# Patient Record
Sex: Female | Born: 1965
Health system: Southern US, Community
[De-identification: ages and names within clinical notes are randomized; demographics above are authoritative.]

## PROBLEM LIST (undated history)

## (undated) DIAGNOSIS — F419 Anxiety disorder, unspecified: Secondary | ICD-10-CM

## (undated) DIAGNOSIS — F329 Major depressive disorder, single episode, unspecified: Secondary | ICD-10-CM

## (undated) DIAGNOSIS — M1990 Unspecified osteoarthritis, unspecified site: Secondary | ICD-10-CM

## (undated) DIAGNOSIS — K649 Unspecified hemorrhoids: Secondary | ICD-10-CM

## (undated) DIAGNOSIS — E785 Hyperlipidemia, unspecified: Secondary | ICD-10-CM

## (undated) DIAGNOSIS — Z803 Family history of malignant neoplasm of breast: Secondary | ICD-10-CM

## (undated) DIAGNOSIS — C801 Malignant (primary) neoplasm, unspecified: Secondary | ICD-10-CM

## (undated) DIAGNOSIS — I10 Essential (primary) hypertension: Secondary | ICD-10-CM

## (undated) DIAGNOSIS — F32A Depression, unspecified: Secondary | ICD-10-CM

## (undated) HISTORY — DX: Unspecified hemorrhoids: K64.9

## (undated) HISTORY — DX: Anxiety disorder, unspecified: F41.9

## (undated) HISTORY — DX: Essential (primary) hypertension: I10

## (undated) HISTORY — DX: Depression, unspecified: F32.A

## (undated) HISTORY — DX: Malignant (primary) neoplasm, unspecified: C80.1

## (undated) HISTORY — DX: Hyperlipidemia, unspecified: E78.5

## (undated) HISTORY — PX: COLONOSCOPY: SHX174

## (undated) HISTORY — DX: Unspecified osteoarthritis, unspecified site: M19.90

## (undated) HISTORY — DX: Major depressive disorder, single episode, unspecified: F32.9

## (undated) HISTORY — PX: PARTIAL HYSTERECTOMY: SHX80

## (undated) HISTORY — PX: MYOMECTOMY: SHX85

## (undated) HISTORY — DX: Family history of malignant neoplasm of breast: Z80.3

---

## 2010-04-24 ENCOUNTER — Ambulatory Visit: Payer: Self-pay | Admitting: Genetic Counselor

## 2010-07-19 ENCOUNTER — Encounter: Payer: BC Managed Care – PPO | Admitting: Genetic Counselor

## 2010-12-24 ENCOUNTER — Ambulatory Visit: Payer: BC Managed Care – PPO | Attending: Specialist | Admitting: Physical Therapy

## 2010-12-24 DIAGNOSIS — R5381 Other malaise: Secondary | ICD-10-CM | POA: Insufficient documentation

## 2010-12-24 DIAGNOSIS — M2560 Stiffness of unspecified joint, not elsewhere classified: Secondary | ICD-10-CM | POA: Insufficient documentation

## 2010-12-24 DIAGNOSIS — M6281 Muscle weakness (generalized): Secondary | ICD-10-CM | POA: Insufficient documentation

## 2010-12-24 DIAGNOSIS — IMO0001 Reserved for inherently not codable concepts without codable children: Secondary | ICD-10-CM | POA: Insufficient documentation

## 2010-12-24 DIAGNOSIS — M2550 Pain in unspecified joint: Secondary | ICD-10-CM | POA: Insufficient documentation

## 2010-12-25 ENCOUNTER — Ambulatory Visit: Payer: BC Managed Care – PPO

## 2010-12-31 ENCOUNTER — Ambulatory Visit: Payer: BC Managed Care – PPO | Attending: Specialist | Admitting: Physical Therapy

## 2010-12-31 DIAGNOSIS — M2560 Stiffness of unspecified joint, not elsewhere classified: Secondary | ICD-10-CM | POA: Insufficient documentation

## 2010-12-31 DIAGNOSIS — R5381 Other malaise: Secondary | ICD-10-CM | POA: Insufficient documentation

## 2010-12-31 DIAGNOSIS — M6281 Muscle weakness (generalized): Secondary | ICD-10-CM | POA: Insufficient documentation

## 2010-12-31 DIAGNOSIS — M2550 Pain in unspecified joint: Secondary | ICD-10-CM | POA: Insufficient documentation

## 2010-12-31 DIAGNOSIS — IMO0001 Reserved for inherently not codable concepts without codable children: Secondary | ICD-10-CM | POA: Insufficient documentation

## 2011-01-02 ENCOUNTER — Ambulatory Visit: Payer: BC Managed Care – PPO | Admitting: Physical Therapy

## 2011-01-03 ENCOUNTER — Encounter: Payer: BC Managed Care – PPO | Admitting: Physical Therapy

## 2011-01-06 ENCOUNTER — Ambulatory Visit: Payer: BC Managed Care – PPO | Admitting: Physical Therapy

## 2011-01-07 ENCOUNTER — Encounter: Payer: BC Managed Care – PPO | Admitting: Physical Therapy

## 2011-01-09 ENCOUNTER — Ambulatory Visit: Payer: BC Managed Care – PPO | Admitting: Physical Therapy

## 2011-01-10 ENCOUNTER — Encounter: Payer: BC Managed Care – PPO | Admitting: Physical Therapy

## 2011-01-14 ENCOUNTER — Ambulatory Visit: Payer: BC Managed Care – PPO | Admitting: Physical Therapy

## 2011-01-16 ENCOUNTER — Ambulatory Visit: Payer: BC Managed Care – PPO | Admitting: Physical Therapy

## 2011-01-21 ENCOUNTER — Encounter: Payer: BC Managed Care – PPO | Admitting: Physical Therapy

## 2011-01-23 ENCOUNTER — Ambulatory Visit: Payer: BC Managed Care – PPO | Admitting: Physical Therapy

## 2011-07-09 ENCOUNTER — Emergency Department (HOSPITAL_COMMUNITY)
Admission: EM | Admit: 2011-07-09 | Discharge: 2011-07-10 | Disposition: A | Discharge status: Home / Self Care | Payer: BC Managed Care – PPO | Attending: Emergency Medicine | Admitting: Emergency Medicine

## 2011-07-09 ENCOUNTER — Encounter (HOSPITAL_COMMUNITY): Payer: Self-pay | Admitting: *Deleted

## 2011-07-09 ENCOUNTER — Emergency Department (HOSPITAL_COMMUNITY): Payer: BC Managed Care – PPO

## 2011-07-09 DIAGNOSIS — IMO0002 Reserved for concepts with insufficient information to code with codable children: Secondary | ICD-10-CM | POA: Insufficient documentation

## 2011-07-09 DIAGNOSIS — X500XXA Overexertion from strenuous movement or load, initial encounter: Secondary | ICD-10-CM | POA: Insufficient documentation

## 2011-07-09 DIAGNOSIS — M25569 Pain in unspecified knee: Secondary | ICD-10-CM | POA: Insufficient documentation

## 2011-07-09 DIAGNOSIS — S8390XA Sprain of unspecified site of unspecified knee, initial encounter: Secondary | ICD-10-CM

## 2011-07-09 DIAGNOSIS — M25469 Effusion, unspecified knee: Secondary | ICD-10-CM

## 2011-07-09 MED ORDER — HYDROCODONE-ACETAMINOPHEN 5-325 MG PO TABS
1.0000 | ORAL_TABLET | Freq: Once | ORAL | Status: AC
  Administered 2011-07-09: 1 via ORAL
  Filled 2011-07-09: qty 1

## 2011-07-09 MED ORDER — OXYCODONE-ACETAMINOPHEN 5-325 MG PO TABS: 1.0000 | ORAL_TABLET | Freq: Four times a day (QID) | ORAL | Status: AC | PRN

## 2011-07-09 NOTE — Discharge Instructions (Signed)
X-rays do not show any broken bones or dislocations. Continue to use rest, ice, compression and elevation to help reduce pain and swelling in her knee. Please followup with orthopedic specialist for continued evaluation and treatment.   Knee Effusion The medical term for having fluid in your knee is effusion. This is often due to an internal derangement of the knee. This means something is wrong inside the knee. Some of the causes of fluid in the knee may be torn cartilage, a torn ligament, or bleeding into the joint from an injury. Your knee is likely more difficult to bend and move. This is often because there is increased pain and pressure in the joint. The time it takes for recovery from a knee effusion depends on different factors, including:   Type of injury.   Your age.   Physical and medical conditions.   Rehabilitation Strategies.  How long you will be away from your normal activities will depend on what kind of knee problem you have and how much damage is present. Your knee has two types of cartilage. Articular cartilage covers the bone ends and lets your knee bend and move smoothly. Two menisci, thick pads of cartilage that form a rim inside the joint, help absorb shock and stabilize your knee. Ligaments bind the bones together and support your knee joint. Muscles move the joint, help support your knee, and take stress off the joint itself. CAUSES  Often an effusion in the knee is caused by an injury to one of the menisci. This is often a tear in the cartilage. Recovery after a meniscus injury depends on how much meniscus is damaged and whether you have damaged other knee tissue. Small tears may heal on their own with conservative treatment. Conservative means rest, limited weight bearing activity and muscle strengthening exercises. Your recovery may take up to 6 weeks.  TREATMENT  Larger tears may require surgery. Meniscus injuries may be treated during arthroscopy. Arthroscopy is a  procedure in which your surgeon uses a small telescope like instrument to look in your knee. Your caregiver can make a more accurate diagnosis (learning what is wrong) by performing an arthroscopic procedure. If your injury is on the inner margin of the meniscus, your surgeon may trim the meniscus back to a smooth rim. In other cases your surgeon will try to repair a damaged meniscus with stitches (sutures). This may make rehabilitation take longer, but may provide better long term result by helping your knee keep its shock absorption capabilities. Ligaments which are completely torn usually require surgery for repair. HOME CARE INSTRUCTIONS  Use crutches as instructed.   If a brace is applied, use as directed.   Once you are home, an ice pack applied to your swollen knee may help with discomfort and help decrease swelling.   Keep your knee raised (elevated) when you are not up and around or on crutches.   Only take over-the-counter or prescription medicines for pain, discomfort, or fever as directed by your caregiver.   Your caregivers will help with instructions for rehabilitation of your knee. This often includes strengthening exercises.   You may resume a normal diet and activities as directed.  SEEK MEDICAL CARE IF:   There is increased swelling in your knee.   You notice redness, swelling, or increasing pain in your knee.   An unexplained oral temperature above 102 F (38.9 C) develops.  SEEK IMMEDIATE MEDICAL CARE IF:   You develop a rash.   You have difficulty  breathing.   You have any allergic reactions from medications you may have been given.   There is severe pain with any motion of the knee.  MAKE SURE YOU:   Understand these instructions.   Will watch your condition.   Will get help right away if you are not doing well or get worse.  Document Released: 06/07/2003 Document Revised: 03/06/2011 Document Reviewed: 08/11/2007 [facility] Patient Information 2012  [place], [place].   Knee Sprain You have a knee sprain. Sprains are painful injuries to the joints. A sprain is a partial or complete tearing of ligaments. Ligaments are tough, fibrous tissues that hold bones together at the joints. A strain (sprain) has occurred when a ligament is stretched or damaged. This injury may take several weeks to heal. This is often the same length of time as a bone fracture (break in bone) takes to heal. Even though a fracture (bone break) may not have occurred, the recovery times may be similar. HOME CARE INSTRUCTIONS   Rest the injured area for as long as directed by your caregiver. Then slowly start using the joint as directed by your caregiver and as the pain allows. Use crutches as directed. If the knee was splinted or casted, continue use and care as directed. If an ace bandage has been applied today, it should be removed and reapplied every 3 to 4 hours. It should not be applied tightly, but firmly enough to keep swelling down. Watch toes and feet for swelling, bluish discoloration, coldness, numbness or excessive pain. If any of these symptoms occur, remove the ace bandage and reapply more loosely.If these symptoms persist, seek medical attention.   For the first 24 hours, lie down. Keep the injured extremity elevated on two pillows.   Apply ice to the injured area for 15 to 20 minutes every couple hours. Repeat this 3 to 4 times per day for the first 48 hours. Put the ice in a plastic bag and place a towel between the bag of ice and your skin.   Wear any splinting, casting, or elastic bandage applications as instructed.   Only take over-the-counter or prescription medicines for pain, discomfort, or fever as directed by your caregiver. Do not use aspirin immediately after the injury unless instructed by your caregiver. Aspirin can cause increased bleeding and bruising of the tissues.   If you were given crutches, continue to use them as instructed. Do not resume  weight bearing on the affected extremity until instructed.  Persistent pain and inability to use the injured area as directed for more than 2 to 3 days are warning signs. If this happens you should see a caregiver for a follow-up visit as soon as possible. Initially, a hairline fracture (this is the same as a broken bone) may not be evident on x-rays. Persistent pain and swelling indicate that further evaluation, non-weight bearing (use of crutches as instructed), and/or further x-rays are indicated. X-rays may sometimes not show a small fracture until a week or ten days later. Make a follow-up appointment with your own caregiver or one to whom we have referred you. A radiologist (specialist in reading x-rays) may re-read your X-rays. Make sure you know how you are to get your x-ray results. Do not assume everything is normal if you do not hear from [place]. SEEK MEDICAL CARE IF:   Bruising, swelling, or pain increases.   You have cold or numb toes   You have continuing difficulty or pain with walking.  SEEK IMMEDIATE MEDICAL  CARE IF:   Your toes are cold, numb or blue.   The pain is not responding to medications and continues to stay the same or get worse.  MAKE SURE YOU:   Understand these instructions.   Will watch your condition.   Will get help right away if you are not doing well or get worse.  Document Released: 03/17/2005 Document Revised: 03/06/2011 Document Reviewed: 03/01/2007 [facility] Patient Information 2012 [place], [place].

## 2011-07-09 NOTE — ED Provider Notes (Signed)
History     CSN: [id number]  Arrival date & time 07/09/11  2021   First MD Initiated Contact with Patient 07/09/11 2141      Chief Complaint  Patient presents with  . Knee Pain     HPI  History provided by the patient. Patient is a 46 year old female with no significant past medical history who presents with complaints of right knee injury and pain. Patient reports that she was outside to see after her puppy on the driveway when she felt a sudden pop in her right knee followed by pain and swelling. Patient reports pain is increased to the point she cannot pull any weight on it. Pain is worse with any movements and improves with resting and elevating. Patient has not taken any medications for the pain. Patient does report having some chronic arthritis-type issues of bilateral knees. She denies any history of surgeries. Patient denies any numbness, tingling or weakness in feet. Patient denies any other aggravating or alleviating factors.    History reviewed. No pertinent past medical history.  History reviewed. No pertinent past surgical history.  History reviewed. No pertinent family history.  History  Substance Use Topics  . Smoking status: Current Everyday Smoker -- 1.0 packs/day  . Smokeless tobacco: Not on file  . Alcohol Use: Yes     socially    OB History    Grav Para Term Preterm Abortions TAB SAB Ect Mult Living                  Review of Systems  Musculoskeletal: Positive for joint swelling.  Neurological: Negative for weakness and numbness.    Allergies  Review of patient's allergies indicates no known allergies.  Home Medications   Current Outpatient Rx  Name Route Sig Dispense Refill  . BUPROPION HCL ER (SR) 150 MG PO TB12 Oral Take 150 mg by mouth daily.    . ADULT MULTIVITAMIN W/MINERALS CH Oral Take 1 tablet by mouth daily.    Marland Kitchen PRESCRIPTION MEDICATION Topical Apply 1 patch topically every 3 (three) days. Wed and sat. vivelle dot micro.      BP  121/78  Pulse 105  Temp 98.9 F (37.2 C)  Resp 16  SpO2 97%  Physical Exam  Nursing note and vitals reviewed. Constitutional: She is oriented to person, place, and time. She appears well-developed and well-nourished. No distress.  HENT:  Head: Normocephalic and atraumatic.  Cardiovascular: Normal rate and regular rhythm.   Pulmonary/Chest: Effort normal and breath sounds normal.  Musculoskeletal:       Reduced range of motion of right knee secondary to pain and swelling. Moderate swelling to right knee with positive ballottement. Negative anterior posterior drawer test. No increased laxity with balance or varus stress. No deformities. Normal dorsal pedal pulses. Normal sensation in toes.  Neurological: She is alert and oriented to person, place, and time.  Skin: Skin is warm and dry. No rash noted.  Psychiatric: She has a normal mood and affect. Her behavior is normal.    ED Course  Procedures      Dg Knee Complete 4 Views Right  07/09/2011  *RADIOLOGY REPORT*  Clinical Data: Right knee pop while running; unable to bear weight. Right knee pain.  RIGHT KNEE - COMPLETE 4+ VIEW  Comparison: None.  Findings: There is no evidence of fracture or dislocation.  The joint spaces are preserved.  No significant degenerative change is seen; the patellofemoral joint is grossly unremarkable in appearance.  A moderate to  large knee joint effusion is suspected.  The visualized soft tissues are normal in appearance.  IMPRESSION:  1.  No evidence of fracture or dislocation. 2.  Moderate to large knee joint effusion suspected.  Given the patient's symptoms, MRI could be considered to assess for internal derangement of the knee.  Original Report Authenticated By: Tonia Ghent, M.D.     1. Joint effusion of knee   2. Knee sprain       MDM  9:40 PM patient seen and evaluated. Patient no acute distress. Will elevate and ice knee. Obtain plain radiographs for evaluation. Pain medication  ordered.        Angus Seller, [place] 07/10/11 [phone]

## 2011-07-09 NOTE — ED Notes (Signed)
Ortho at bedside for splinting.

## 2011-07-09 NOTE — ED Notes (Signed)
Ortho paged again for immobilizer and crutches

## 2011-07-09 NOTE — ED Notes (Signed)
Pt reports running and feeling something pop in her (R) knee.  Denies tenderness on palpation, no swelling or bruising noted.  Pt unable to ambulate.  Pt has sensation and movement.

## 2011-07-10 NOTE — ED Notes (Signed)
Pt denies any further questions upon discharge. 

## 2014-10-16 ENCOUNTER — Encounter: Payer: Self-pay | Admitting: Genetic Counselor

## 2015-08-22 ENCOUNTER — Encounter: Payer: Self-pay | Admitting: Internal Medicine

## 2015-10-04 ENCOUNTER — Ambulatory Visit (AMBULATORY_SURGERY_CENTER): Payer: Self-pay | Admitting: *Deleted

## 2015-10-04 ENCOUNTER — Encounter: Payer: Self-pay | Admitting: Internal Medicine

## 2015-10-04 VITALS — Ht 64.5 in | Wt 181.6 lb

## 2015-10-04 DIAGNOSIS — Z1211 Encounter for screening for malignant neoplasm of colon: Secondary | ICD-10-CM

## 2015-10-04 MED ORDER — NA SULFATE-K SULFATE-MG SULF 17.5-3.13-1.6 GM/177ML PO SOLN: 1.0000 | Freq: Once | ORAL | Status: DC

## 2015-10-04 NOTE — Progress Notes (Signed)
No egg or soy allergy known to patient  No issues with past sedation with any surgeries  or procedures, no intubation problems  No diet pills per patient No home 02 use per patient  No blood thinners per patient  Pt denies issues with constipation  emmi video to e mail  

## 2015-10-10 ENCOUNTER — Encounter: Payer: Self-pay | Admitting: Internal Medicine

## 2015-10-10 ENCOUNTER — Ambulatory Visit (AMBULATORY_SURGERY_CENTER): Payer: 59 | Admitting: Internal Medicine

## 2015-10-10 VITALS — BP 130/84 | HR 84 | Temp 98.4°F | Resp 11 | Ht 64.5 in | Wt 181.6 lb

## 2015-10-10 DIAGNOSIS — D124 Benign neoplasm of descending colon: Secondary | ICD-10-CM

## 2015-10-10 DIAGNOSIS — Z1211 Encounter for screening for malignant neoplasm of colon: Secondary | ICD-10-CM | POA: Diagnosis not present

## 2015-10-10 MED ORDER — SODIUM CHLORIDE 0.9 % IV SOLN: 500.0000 mL | INTRAVENOUS | Status: DC

## 2015-10-10 NOTE — Patient Instructions (Signed)
Colon polyps removed today, diverticulosis seen. Handouts given on polyps,diverticulosis and hemorrhoids.  Result letter in your mail in 2-3 weeks. Resume current medications.  Call us with any questions or concerns. Thank you!  YOU HAD AN ENDOSCOPIC PROCEDURE TODAY AT [facility]:   Refer to the procedure report that was given to you for any specific questions about what was found during the examination.  If the procedure report does not answer your questions, please call your gastroenterologist to clarify.  If you requested that your care partner not be given the details of your procedure findings, then the procedure report has been included in a sealed envelope for you to review at your convenience later.  YOU SHOULD EXPECT: Some feelings of bloating in the abdomen. Passage of more gas than usual.  Walking can help get rid of the air that was put into your GI tract during the procedure and reduce the bloating. If you had a lower endoscopy (such as a colonoscopy or flexible sigmoidoscopy) you may notice spotting of blood in your stool or on the toilet paper. If you underwent a bowel prep for your procedure, you may not have a normal bowel movement for a few days.  Please Note:  You might notice some irritation and congestion in your nose or some drainage.  This is from the oxygen used during your procedure.  There is no need for concern and it should clear up in a day or so.  SYMPTOMS TO REPORT IMMEDIATELY:   Following lower endoscopy (colonoscopy or flexible sigmoidoscopy):  Excessive amounts of blood in the stool  Significant tenderness or worsening of abdominal pains  Swelling of the abdomen that is new, acute  Fever of 100F or higher  For urgent or emergent issues, a gastroenterologist can be reached at any hour by calling [phone].   DIET: Your first meal following the procedure should be a small meal and then it is ok to progress to your normal diet. Heavy or  fried foods are harder to digest and may make you feel nauseous or bloated.  Likewise, meals heavy in dairy and vegetables can increase bloating.  Drink plenty of fluids but you should avoid alcoholic beverages for 24 hours.  ACTIVITY:  You should plan to take it easy for the rest of today and you should NOT DRIVE or use heavy machinery until tomorrow (because of the sedation medicines used during the test).    FOLLOW UP: Our staff will call the number listed on your records the next business day following your procedure to check on you and address any questions or concerns that you may have regarding the information given to you following your procedure. If we do not reach you, we will leave a message.  However, if you are feeling well and you are not experiencing any problems, there is no need to return our call.  We will assume that you have returned to your regular daily activities without incident.  If any biopsies were taken you will be contacted by phone or by letter within the next 1-3 weeks.  Please call us at [phone] if you have not heard about the biopsies in 3 weeks.    SIGNATURES/CONFIDENTIALITY: You and/or your care partner have signed paperwork which will be entered into your electronic medical record.  These signatures attest to the fact that that the information above on your After Visit Summary has been reviewed and is understood.  Full responsibility of the confidentiality of  this discharge information lies with you and/or your care-partner.

## 2015-10-10 NOTE — Progress Notes (Signed)
Called to room to assist during endoscopic procedure.  Patient ID and intended procedure confirmed with present staff. Received instructions for my participation in the procedure from the performing physician.  

## 2015-10-10 NOTE — Op Note (Signed)
[facility] Patient Name: Rachel Bell Procedure Date: 10/10/2015 4:06 PM MRN: [id number] Endoscopist: Jerene Bears , MD Age: 50 Referring MD:  Date of Birth: 1965-10-07 Gender: Female Account #: [id number] Procedure:                Colonoscopy Indications:              Screening for colorectal malignant neoplasm Medicines:                Monitored Anesthesia Care Procedure:                Pre-Anesthesia Assessment:                           - Prior to the procedure, a History and Physical                            was performed, and patient medications and                            allergies were reviewed. The patient's tolerance of                            previous anesthesia was also reviewed. The risks                            and benefits of the procedure and the sedation                            options and risks were discussed with the patient.                            All questions were answered, and informed consent                            was obtained. Prior Anticoagulants: The patient has                            taken no previous anticoagulant or antiplatelet                            agents. ASA Grade Assessment: II - A patient with                            mild systemic disease. After reviewing the risks                            and benefits, the patient was deemed in                            satisfactory condition to undergo the procedure.                           After obtaining informed consent, the colonoscope  was passed under direct vision. Throughout the                            procedure, the patient's blood pressure, pulse, and                            oxygen saturations were monitored continuously. The                            Model PCF-H190DL [id number]) scope was introduced                            through the anus and advanced to the the cecum,                            identified by  appendiceal orifice and ileocecal                            valve. The colonoscopy was performed without                            difficulty. The patient tolerated the procedure                            well. The quality of the bowel preparation was                            good. The ileocecal valve, appendiceal orifice, and                            rectum were photographed. Scope In: 4:21:06 PM Scope Out: 4:34:25 PM Scope Withdrawal Time: 0 hours 9 minutes 46 seconds  Total Procedure Duration: 0 hours 13 minutes 19 seconds  Findings:                 The digital rectal exam was normal.                           A 3 mm polyp was found in the descending colon. The                            polyp was sessile. The polyp was removed with a                            cold snare. Resection and retrieval were complete.                           Scattered small-mouthed diverticula were found in                            the sigmoid colon.                           External hemorrhoids were found during  retroflexion. The hemorrhoids were small. Complications:            No immediate complications. Estimated Blood Loss:     Estimated blood loss was minimal. Impression:               - One 3 mm polyp in the descending colon, removed                            with a cold snare. Resected and retrieved.                           - Diverticulosis in the sigmoid colon.                           - Small external hemorrhoids. Recommendation:           - Patient has a contact number available for                            emergencies. The signs and symptoms of potential                            delayed complications were discussed with the                            patient. Return to normal activities tomorrow.                            Written discharge instructions were provided to the                            patient.                           - Resume  previous diet.                           - Continue present medications.                           - Await pathology results.                           - Repeat colonoscopy is recommended. The                            colonoscopy date will be determined after pathology                            results from today's exam become available for                            review. Jerene Bears, MD 10/10/2015 4:37:33 PM This report has been signed electronically.

## 2015-10-10 NOTE — Progress Notes (Signed)
Rachel Bell, CMA reported to me that when the pt was taken into the procedure room that her son and her mother-in-law were in the waiting room.  Pt said it was ok for her mother-in-law to come into the recovery room and hear the results of her colonoscopy form Dr. Hilarie Fredrickson.  However, pt did not want her son to come into the recovery room.  I told this to [employer], [employer] manager and also Mohammed Kindle, CMA in the recovery room to give this info to Sundra Aland, RN, recovery room nurse.  Rachel also removed the HIPPA sign from the IV pole. maw

## 2015-10-10 NOTE — Progress Notes (Signed)
To recovery, report to [place], RN, VSS. 

## 2015-10-11 ENCOUNTER — Telehealth: Payer: Self-pay

## 2015-10-11 NOTE — Telephone Encounter (Signed)
  Follow up Call-  Call back number 10/10/2015  Post procedure Call Back phone  # [phone]  Permission to leave phone message Yes    Patient was called for follow up after her procedure on 10/10/2015. No answer at the number given for follow up phone call. A message was left on the answering machine.

## 2015-10-17 ENCOUNTER — Encounter: Payer: Self-pay | Admitting: Internal Medicine

## 2015-10-18 ENCOUNTER — Encounter: Payer: Self-pay | Admitting: Internal Medicine

## 2016-04-07 DIAGNOSIS — E7800 Pure hypercholesterolemia, unspecified: Secondary | ICD-10-CM | POA: Diagnosis not present

## 2016-04-07 DIAGNOSIS — R740 Nonspecific elevation of levels of transaminase and lactic acid dehydrogenase [LDH]: Secondary | ICD-10-CM | POA: Diagnosis not present

## 2016-04-07 DIAGNOSIS — I10 Essential (primary) hypertension: Secondary | ICD-10-CM | POA: Diagnosis not present

## 2016-04-14 ENCOUNTER — Other Ambulatory Visit: Payer: Self-pay | Admitting: Family Medicine

## 2016-04-14 DIAGNOSIS — R7401 Elevation of levels of liver transaminase levels: Secondary | ICD-10-CM

## 2016-04-14 DIAGNOSIS — R740 Nonspecific elevation of levels of transaminase and lactic acid dehydrogenase [LDH]: Principal | ICD-10-CM

## 2016-04-15 DIAGNOSIS — C4359 Malignant melanoma of other part of trunk: Secondary | ICD-10-CM | POA: Diagnosis not present

## 2016-04-15 DIAGNOSIS — D3611 Benign neoplasm of peripheral nerves and autonomic nervous system of face, head, and neck: Secondary | ICD-10-CM | POA: Diagnosis not present

## 2016-04-15 DIAGNOSIS — D485 Neoplasm of uncertain behavior of skin: Secondary | ICD-10-CM | POA: Diagnosis not present

## 2016-04-15 DIAGNOSIS — D2230 Melanocytic nevi of unspecified part of face: Secondary | ICD-10-CM | POA: Diagnosis not present

## 2016-04-15 DIAGNOSIS — D224 Melanocytic nevi of scalp and neck: Secondary | ICD-10-CM | POA: Diagnosis not present

## 2016-04-21 ENCOUNTER — Ambulatory Visit
Admission: RE | Admit: 2016-04-21 | Discharge: 2016-04-21 | Disposition: A | Discharge status: Home / Self Care | Payer: 59 | Source: Ambulatory Visit | Attending: Family Medicine | Admitting: Family Medicine

## 2016-04-21 DIAGNOSIS — R740 Nonspecific elevation of levels of transaminase and lactic acid dehydrogenase [LDH]: Secondary | ICD-10-CM | POA: Diagnosis not present

## 2016-04-21 DIAGNOSIS — R7401 Elevation of levels of liver transaminase levels: Secondary | ICD-10-CM

## 2016-04-28 DIAGNOSIS — B999 Unspecified infectious disease: Secondary | ICD-10-CM | POA: Diagnosis not present

## 2016-05-13 DIAGNOSIS — L905 Scar conditions and fibrosis of skin: Secondary | ICD-10-CM | POA: Diagnosis not present

## 2016-05-13 DIAGNOSIS — C4359 Malignant melanoma of other part of trunk: Secondary | ICD-10-CM | POA: Diagnosis not present

## 2016-05-30 DIAGNOSIS — Z1231 Encounter for screening mammogram for malignant neoplasm of breast: Secondary | ICD-10-CM | POA: Diagnosis not present

## 2016-08-27 DIAGNOSIS — D224 Melanocytic nevi of scalp and neck: Secondary | ICD-10-CM | POA: Diagnosis not present

## 2016-08-27 DIAGNOSIS — Z808 Family history of malignant neoplasm of other organs or systems: Secondary | ICD-10-CM | POA: Diagnosis not present

## 2016-08-27 DIAGNOSIS — Z86018 Personal history of other benign neoplasm: Secondary | ICD-10-CM | POA: Diagnosis not present

## 2016-10-21 DIAGNOSIS — Z01419 Encounter for gynecological examination (general) (routine) without abnormal findings: Secondary | ICD-10-CM | POA: Diagnosis not present

## 2016-11-18 DIAGNOSIS — D2230 Melanocytic nevi of unspecified part of face: Secondary | ICD-10-CM | POA: Diagnosis not present

## 2016-11-18 DIAGNOSIS — D3611 Benign neoplasm of peripheral nerves and autonomic nervous system of face, head, and neck: Secondary | ICD-10-CM | POA: Diagnosis not present

## 2016-11-18 DIAGNOSIS — D224 Melanocytic nevi of scalp and neck: Secondary | ICD-10-CM | POA: Diagnosis not present

## 2016-12-22 DIAGNOSIS — Z23 Encounter for immunization: Secondary | ICD-10-CM | POA: Diagnosis not present

## 2016-12-22 DIAGNOSIS — E7800 Pure hypercholesterolemia, unspecified: Secondary | ICD-10-CM | POA: Diagnosis not present

## 2017-03-04 DIAGNOSIS — D224 Melanocytic nevi of scalp and neck: Secondary | ICD-10-CM | POA: Diagnosis not present

## 2017-03-04 DIAGNOSIS — D2230 Melanocytic nevi of unspecified part of face: Secondary | ICD-10-CM | POA: Diagnosis not present

## 2017-03-04 DIAGNOSIS — D3611 Benign neoplasm of peripheral nerves and autonomic nervous system of face, head, and neck: Secondary | ICD-10-CM | POA: Diagnosis not present

## 2017-03-18 DIAGNOSIS — M2550 Pain in unspecified joint: Secondary | ICD-10-CM | POA: Diagnosis not present

## 2017-03-18 DIAGNOSIS — L409 Psoriasis, unspecified: Secondary | ICD-10-CM | POA: Diagnosis not present

## 2017-03-18 DIAGNOSIS — G5601 Carpal tunnel syndrome, right upper limb: Secondary | ICD-10-CM | POA: Diagnosis not present

## 2017-04-23 ENCOUNTER — Encounter: Payer: 59 | Admitting: Neurology

## 2017-04-28 DIAGNOSIS — M545 Low back pain: Secondary | ICD-10-CM | POA: Diagnosis not present

## 2017-04-28 DIAGNOSIS — M2550 Pain in unspecified joint: Secondary | ICD-10-CM | POA: Diagnosis not present

## 2017-05-05 DIAGNOSIS — M7662 Achilles tendinitis, left leg: Secondary | ICD-10-CM | POA: Diagnosis not present

## 2017-05-05 DIAGNOSIS — G5601 Carpal tunnel syndrome, right upper limb: Secondary | ICD-10-CM | POA: Diagnosis not present

## 2017-06-18 DIAGNOSIS — M7662 Achilles tendinitis, left leg: Secondary | ICD-10-CM | POA: Diagnosis not present

## 2017-06-18 DIAGNOSIS — M25572 Pain in left ankle and joints of left foot: Secondary | ICD-10-CM | POA: Diagnosis not present

## 2017-06-18 DIAGNOSIS — M6281 Muscle weakness (generalized): Secondary | ICD-10-CM | POA: Diagnosis not present

## 2017-06-23 DIAGNOSIS — Z1231 Encounter for screening mammogram for malignant neoplasm of breast: Secondary | ICD-10-CM | POA: Diagnosis not present

## 2017-06-24 DIAGNOSIS — M25572 Pain in left ankle and joints of left foot: Secondary | ICD-10-CM | POA: Diagnosis not present

## 2017-06-24 DIAGNOSIS — M6281 Muscle weakness (generalized): Secondary | ICD-10-CM | POA: Diagnosis not present

## 2017-06-24 DIAGNOSIS — M7662 Achilles tendinitis, left leg: Secondary | ICD-10-CM | POA: Diagnosis not present

## 2017-06-26 DIAGNOSIS — M7662 Achilles tendinitis, left leg: Secondary | ICD-10-CM | POA: Diagnosis not present

## 2017-06-26 DIAGNOSIS — M25572 Pain in left ankle and joints of left foot: Secondary | ICD-10-CM | POA: Diagnosis not present

## 2017-06-26 DIAGNOSIS — M6281 Muscle weakness (generalized): Secondary | ICD-10-CM | POA: Diagnosis not present

## 2017-06-30 DIAGNOSIS — M6281 Muscle weakness (generalized): Secondary | ICD-10-CM | POA: Diagnosis not present

## 2017-06-30 DIAGNOSIS — M25572 Pain in left ankle and joints of left foot: Secondary | ICD-10-CM | POA: Diagnosis not present

## 2017-06-30 DIAGNOSIS — M7662 Achilles tendinitis, left leg: Secondary | ICD-10-CM | POA: Diagnosis not present

## 2017-07-02 DIAGNOSIS — M6281 Muscle weakness (generalized): Secondary | ICD-10-CM | POA: Diagnosis not present

## 2017-07-02 DIAGNOSIS — M7662 Achilles tendinitis, left leg: Secondary | ICD-10-CM | POA: Diagnosis not present

## 2017-07-02 DIAGNOSIS — M25572 Pain in left ankle and joints of left foot: Secondary | ICD-10-CM | POA: Diagnosis not present

## 2017-09-02 DIAGNOSIS — D225 Melanocytic nevi of trunk: Secondary | ICD-10-CM | POA: Diagnosis not present

## 2017-09-02 DIAGNOSIS — D485 Neoplasm of uncertain behavior of skin: Secondary | ICD-10-CM | POA: Diagnosis not present

## 2017-09-02 DIAGNOSIS — L409 Psoriasis, unspecified: Secondary | ICD-10-CM | POA: Diagnosis not present

## 2017-09-02 DIAGNOSIS — D3611 Benign neoplasm of peripheral nerves and autonomic nervous system of face, head, and neck: Secondary | ICD-10-CM | POA: Diagnosis not present

## 2017-09-02 DIAGNOSIS — I10 Essential (primary) hypertension: Secondary | ICD-10-CM | POA: Diagnosis not present

## 2017-09-02 DIAGNOSIS — E7800 Pure hypercholesterolemia, unspecified: Secondary | ICD-10-CM | POA: Diagnosis not present

## 2017-10-23 DIAGNOSIS — Z01419 Encounter for gynecological examination (general) (routine) without abnormal findings: Secondary | ICD-10-CM | POA: Diagnosis not present

## 2018-01-01 DIAGNOSIS — Z0000 Encounter for general adult medical examination without abnormal findings: Secondary | ICD-10-CM | POA: Diagnosis not present

## 2018-01-01 DIAGNOSIS — Z23 Encounter for immunization: Secondary | ICD-10-CM | POA: Diagnosis not present

## 2018-01-01 DIAGNOSIS — E7800 Pure hypercholesterolemia, unspecified: Secondary | ICD-10-CM | POA: Diagnosis not present

## 2018-01-26 ENCOUNTER — Other Ambulatory Visit (HOSPITAL_COMMUNITY): Payer: Self-pay | Admitting: Orthopedic Surgery

## 2018-01-26 DIAGNOSIS — M7989 Other specified soft tissue disorders: Secondary | ICD-10-CM

## 2018-01-26 DIAGNOSIS — M25561 Pain in right knee: Secondary | ICD-10-CM | POA: Diagnosis not present

## 2018-01-26 DIAGNOSIS — M79604 Pain in right leg: Secondary | ICD-10-CM

## 2018-01-27 ENCOUNTER — Ambulatory Visit (HOSPITAL_COMMUNITY)
Admission: RE | Admit: 2018-01-27 | Discharge: 2018-01-27 | Disposition: A | Discharge status: Home / Self Care | Payer: 59 | Source: Ambulatory Visit | Attending: Orthopedic Surgery | Admitting: Orthopedic Surgery

## 2018-01-27 ENCOUNTER — Ambulatory Visit (HOSPITAL_COMMUNITY): Payer: Self-pay

## 2018-01-27 ENCOUNTER — Encounter (HOSPITAL_COMMUNITY): Payer: Self-pay

## 2018-01-27 DIAGNOSIS — M7989 Other specified soft tissue disorders: Secondary | ICD-10-CM

## 2018-01-27 DIAGNOSIS — M7121 Synovial cyst of popliteal space [Baker], right knee: Secondary | ICD-10-CM | POA: Insufficient documentation

## 2018-01-27 DIAGNOSIS — M79604 Pain in right leg: Secondary | ICD-10-CM | POA: Diagnosis not present

## 2018-01-27 NOTE — Progress Notes (Addendum)
RLE venous duplex prelim: negative for DVT. Baker's cyst noted.  Landry Mellow, RDMS, RVT  Attempted to call report with no answer. Did not leave message on personal line.

## 2018-02-09 DIAGNOSIS — M25561 Pain in right knee: Secondary | ICD-10-CM | POA: Diagnosis not present

## 2018-02-16 ENCOUNTER — Encounter: Payer: Self-pay | Admitting: Genetics

## 2018-02-16 ENCOUNTER — Telehealth: Payer: Self-pay | Admitting: Genetics

## 2018-02-16 NOTE — Telephone Encounter (Signed)
Pt cld back to schedule a genetic counseling appt. Pt has been scheduled to see Ferol Luz on 12/17 at 11am. Pt aware to arrive 15 minutes early. Letter mailed.

## 2018-03-16 ENCOUNTER — Encounter: Payer: Self-pay | Admitting: Genetics

## 2018-03-16 ENCOUNTER — Inpatient Hospital Stay: Payer: 59

## 2018-03-16 ENCOUNTER — Inpatient Hospital Stay: Payer: 59 | Attending: Genetic Counselor | Admitting: Genetics

## 2018-03-16 DIAGNOSIS — Z803 Family history of malignant neoplasm of breast: Secondary | ICD-10-CM

## 2018-03-16 DIAGNOSIS — Z808 Family history of malignant neoplasm of other organs or systems: Secondary | ICD-10-CM | POA: Diagnosis not present

## 2018-03-16 DIAGNOSIS — Z809 Family history of malignant neoplasm, unspecified: Secondary | ICD-10-CM

## 2018-03-16 NOTE — Progress Notes (Signed)
REFERRING PROVIDER: Everlene Farrier, [place], [place] [address]  PRIMARY PROVIDER:  Koirala, Dibas, MD  PRIMARY REASON FOR VISIT:  1. Family history of breast cancer   2. Family history of cancer     HISTORY OF PRESENT ILLNESS:   Rachel Bell, a 52 y.o. female, was seen for a [facility] cancer genetics consultation at the request of Dr. Gaetano Net due to a family history of cancer.  Rachel Bell presents to clinic today to discuss the possibility of a hereditary predisposition to cancer, genetic testing, and to further clarify her future cancer risks, as well as potential cancer risks for family members.   In April 2012, Rachel Bell was seen by a Dietitian and she had genetic testing for BRCA1/2 (including 5-site- rearrangement).  This testing was negative.  She had discussed possibly testing for TP53 with the genetic counselor thinking that maybe her mother's cancer was a sarcoma.  She reports she received a letter from [place] a while back informing her that more advanced genetic testing was available and recommending she check in with [place].   Rachel Bell reports she has had several skin cancers and atypical moles removed.  She does not know the pathology of all of them, but does recall 1 melanoma dx 2 years ago.  It was excised and she did not need further treatment.   HORMONAL RISK FACTORS:  Menarche was at age 50.  First live birth at age 50.  Ovaries intact: yes.  Hysterectomy: yes.  Menopausal status: postmenopausal. Reports going through menopause after hysterectomy- mid 40's.  HRT use: yes, currently patch.  Colonoscopy: yes; 2017, 1 polyp. Mammogram within the last year: yes. Number of breast biopsies: 0.  Past Medical History:  Diagnosis Date  . Anxiety   . Arthritis   . Cancer ([place])    skin cancer  . Depression   . Family history of breast cancer   . Hemorrhoids   . Hyperlipidemia   . Hypertension     Past Surgical History:  Procedure  Laterality Date  . CESAREAN SECTION    . COLONOSCOPY     > 11 yrs in [employer]- pt cannot find records or who   . MYOMECTOMY    . PARTIAL HYSTERECTOMY      Social History   Socioeconomic History  . Marital status: Married    Spouse name: Not on file  . Number of children: Not on file  . Years of education: Not on file  . Highest education level: Not on file  Occupational History  . Not on file  Social Needs  . Financial resource strain: Not on file  . Food insecurity:    Worry: Not on file    Inability: Not on file  . Transportation needs:    Medical: Not on file    Non-medical: Not on file  Tobacco Use  . Smoking status: Current Every Day Smoker    Packs/day: 1.00  . Smokeless tobacco: Never Used  Substance and Sexual Activity  . Alcohol use: Yes    Alcohol/week: 0.0 standard drinks    Comment: socially  . Drug use: No  . Sexual activity: Not on file  Lifestyle  . Physical activity:    Days per week: Not on file    Minutes per session: Not on file  . Stress: Not on file  Relationships  . Social connections:    Talks on phone: Not on file    Gets together: Not  on file    Attends religious service: Not on file    Active member of club or organization: Not on file    Attends meetings of clubs or organizations: Not on file    Relationship status: Not on file  Other Topics Concern  . Not on file  Social History Narrative  . Not on file     FAMILY HISTORY:  We obtained a detailed, 4-generation family history.  Significant diagnoses are listed below: Family History  Adopted: Yes  Problem Relation Age of Onset  . Cancer Mother 71       died of a rare type of cancer- maybe sarcoma?  . Breast cancer Half-Sister 31       died at 16 of breast cancer  . Colon cancer Neg Hx   . Colon polyps Neg Hx   . Rectal cancer Neg Hx   . Stomach cancer Neg Hx   . Esophageal cancer Neg Hx     Rachel Bell has a 37 year-old son with no hx of cancer.   Rachel Bell is adopted,  so she has very limited information about her family.  All that she knows is:  -1 paternal half sister died of breast cancer at 66 -her biological mother died at 67 due to a rare type of cancer- maybe sarcoma. -She has a materna aunt who says skin cancer runs in her maternal side of the family   Her family is caucasian, some german ancestry.  No known Ashkenazi Jewish ancestry, but is not sure.  As far as she is aware no consanguinity, but again she has limited information.   GENETIC COUNSELING ASSESSMENT: Rachel Bell is a 52 y.o. female with a family history which is somewhat suggestive of a Hereditary Cancer Predisposition Syndrome. We, therefore, discussed and recommended the following at today's visit.   DISCUSSION: We reviewed the characteristics, features and inheritance patterns of hereditary cancer syndromes. We also discussed genetic testing, including the appropriate family members to test, the process of testing, insurance coverage and turn-around-time for results. We discussed the implications of a negative, positive and/or variant of uncertain significant result. We recommended Rachel Bell pursue genetic testing for the Multi-cancer gene panel.   The Multi-Cancer Panel offered by [facility] includes sequencing and/or deletion duplication testing of the following 90 genes: AIP, ALK, APC, ATM, AXIN2, BAP1, BARD1, BLM, BMPR1A, BRCA1, BRCA2, BRIP1, BUB1B, CASR, CDC73, CDH1, CDK4, CDKN1B, CDKN1C, CDKN2A, CEBPA, CHEK2, CTNNA1, DICER1, DIS3L2, EGFR, ENG, EPCAM, FH, FLCN, GALNT12, GATA2, GPC3, GREM1, HOXB13, HRAS, KIT, MAX, MEN1, MET, MITF, MLH1, MLH3, MSH2, MSH3, MSH6, MUTYH, NBN, NF1, NF2, NTHL1, PALB2, PDGFRA, PHOX2B, PMS2, POLD1, POLE, POT1, PRKAR1A, PTCH1, PTEN, RAD50, RAD51C, RAD51D, RB1, RECQL4, RET, RNF43, RPS20, RUNX1, SDHA, SDHAF2, SDHB, SDHC, SDHD, SMAD4, SMARCA4, SMARCB1, SMARCE1, STK11, SUFU, TERC, TERT, TMEM127, TP53, TSC1, TSC2, VHL, WRN, WT1  We discussed the concept of  hereditary cancer risk and discussed panel testing which is available now, but was not when she was originally tested in 2012.   There many other cancer predisposition syndromes caused by mutations in several other genes.  Rachel Bell elected to have a pan-cancer panel tested.   We discussed that if she is found to have a mutation in one of these genes, it may impact future medical management recommendations such as increased cancer screenings and consideration of risk reducing surgeries.  A positive result could also have implications for the patient's family members.  A Negative result would mean we did not identify  a hereditary predisposition to cancer in her, but does not rule out the possibility of a hereditary risk for cancer.  There could still  be mutations that are undetectable by current technology, or in genes not yet tested or identified to increase cancer risk.    We discussed the potential to find a Variant of Uncertain Significance or VUS.  These are variants that have not yet been identified as pathogenic or benign, and it is unknown if this variant is associated with increased cancer risk or if this is a normal finding.  Most VUS's are reclassified to benign or likely benign.   It should not be used to make medical management decisions. With time, we suspect the lab will determine the significance of any VUS's identified if any.   Based on Rachel Bell's family history of cancer, she meets medical criteria for genetic testing. Despite that she meets criteria, she may still have an out of pocket cost. The laboratory can provide her with an estimate of her OOP cost.  she was given the contact information for the laboratory if she has further questions. .   Based on the patient's personal and family history, the statistical model (Tyrer c=Cusik)   Was used to estimate her risk of developing breast cancer. This estimates her lifetime risk of developing breast cancer to be approximately 16.1%  This estimation is performed in the setting negative genetic test results.  A positive result may significantly impact this risk assessment.  The patient's lifetime breast cancer risk is a preliminary estimate based on available information using one of several models endorsed by the [place] (ACS). The ACS recommends consideration of breast MRI screening as an adjunct to mammography for patients at high risk (defined as 20% or greater lifetime risk). A more detailed breast cancer risk assessment can be considered, if clinically indicated.    We discussed that some people do not want to undergo genetic testing due to fear of genetic discrimination.  A federal law called the Genetic Information Non-Discrimination Act (GINA) of 2008 helps protect individuals against genetic discrimination based on their genetic test results.  It impacts both health insurance and employment.  For health insurance, it protects against increased premiums, being kicked off insurance or being forced to take a test in order to be insured.  For employment it protects against hiring, firing and promoting decisions based on genetic test results.  Health status due to a cancer diagnosis is not protected under GINA.  This law does not protect life insurance, disability insurance, or other types of insurance.   PLAN: After considering the risks, benefits, and limitations, Rachel Bell  provided informed consent to pursue genetic testing and the blood sample was sent to [facility] for analysis of the Multi-Cancer Panel. Results should be available within approximately 2-3 weeks' time, at which point they will be disclosed by telephone to Rachel Bell, as will any additional recommendations warranted by these results. Rachel Bell will receive a summary of her genetic counseling visit and a copy of her results once available. This information will also be available in Epic. We encouraged Rachel Bell to remain in contact  with cancer genetics annually so that we can continuously update the family history and inform her of any changes in cancer genetics and testing that may be of benefit for her family. Rachel Bell questions were answered to her satisfaction today. Our contact information was provided should additional questions or concerns arise.  Based on Rachel Bell's  family history, we recommended her paternal and possibly maternal relatives also have genetic counseling and testing.   Lastly, we encouraged Rachel Bell to remain in contact with cancer genetics annually so that we can continuously update the family history and inform her of any changes in cancer genetics and testing that may be of benefit for this family.   Ms.  Bell questions were answered to her satisfaction today. Our contact information was provided should additional questions or concerns arise. Thank you for the referral and allowing [place] to share in the care of your patient.   Tana Felts, MS, [facility] Certified Genetic Counselor [email or web address] phone: [phone]  The patient was seen for a total of 35 minutes in face-to-face genetic counseling. This patient was discussed with Drs. Magrinat, Lindi Adie and/or Burr Medico who agrees with the above.

## 2018-04-06 DIAGNOSIS — Z808 Family history of malignant neoplasm of other organs or systems: Secondary | ICD-10-CM | POA: Diagnosis not present

## 2018-04-06 DIAGNOSIS — D224 Melanocytic nevi of scalp and neck: Secondary | ICD-10-CM | POA: Diagnosis not present

## 2018-04-06 DIAGNOSIS — D485 Neoplasm of uncertain behavior of skin: Secondary | ICD-10-CM | POA: Diagnosis not present

## 2018-04-06 DIAGNOSIS — D2262 Melanocytic nevi of left upper limb, including shoulder: Secondary | ICD-10-CM | POA: Diagnosis not present

## 2018-04-06 DIAGNOSIS — L409 Psoriasis, unspecified: Secondary | ICD-10-CM | POA: Diagnosis not present

## 2018-04-07 ENCOUNTER — Telehealth: Payer: Self-pay | Admitting: Genetics

## 2018-04-12 NOTE — Telephone Encounter (Signed)
Revealed negative genetic testing.  Revealed that a VUS in PRKAR1A was identified.   This normal result is reassuring and indicates that it is unlikely Rachel Bell has a hereditary predisposition to cancer syndrome. It is unlikely that there is an increased risk of another cancer due to a mutation in one of these genes.  However, genetic testing is not perfect, and cannot definitively rule out a hereditary cause.  It will be important for her to keep in contact with genetics to learn if any additional testing may be needed in the future.    Discussed that family history still impacts risk.  Recommended she continue to follow her doctors recommendations regarding cancer screening.

## 2018-04-15 ENCOUNTER — Ambulatory Visit: Payer: Self-pay | Admitting: Genetics

## 2018-04-15 ENCOUNTER — Encounter: Payer: Self-pay | Admitting: Genetics

## 2018-04-15 DIAGNOSIS — Z809 Family history of malignant neoplasm, unspecified: Secondary | ICD-10-CM

## 2018-04-15 DIAGNOSIS — Z1379 Encounter for other screening for genetic and chromosomal anomalies: Secondary | ICD-10-CM

## 2018-04-15 DIAGNOSIS — Z803 Family history of malignant neoplasm of breast: Secondary | ICD-10-CM

## 2018-04-15 NOTE — Progress Notes (Signed)
HPI:  Ms. Passon was previously seen in the [facility] clinic on 03/16/2018 due to a family history of cancer and concerns regarding a hereditary predisposition to cancer. Please refer to our prior cancer genetics clinic note for more information regarding Ms. Stahly's medical, social and family histories, and our assessment and recommendations, at the time. Ms. Colasurdo recent genetic test results were disclosed to her, as well as recommendations warranted by these results. These results and recommendations are discussed in more detail below.   FAMILY HISTORY:  We obtained a detailed, 4-generation family history.  Significant diagnoses are listed below: Family History  Adopted: Yes  Problem Relation Age of Onset  . Cancer Mother 40       died of a rare type of cancer- maybe sarcoma?  . Breast cancer Half-Sister 37       died at 15 of breast cancer  . Colon cancer Neg Hx   . Colon polyps Neg Hx   . Rectal cancer Neg Hx   . Stomach cancer Neg Hx   . Esophageal cancer Neg Hx     Ms. Shelvin has a 69 year-old son with no hx of cancer.   Ms. Ho is adopted, so she has very limited information about her family.  All that she knows is:  -1 paternal half sister died of breast cancer at 34 -her biological mother died at 65 due to a rare type of cancer- maybe sarcoma. -She has a materna aunt who says skin cancer runs in her maternal side of the family   Her family is caucasian, some german ancestry.  No known Ashkenazi Jewish ancestry, but is not sure.  As far as she is aware no consanguinity, but again she has limited information.   GENETIC TEST RESULTS: Genetic testing performed through [facility]'s Multi-Cancer Panel reported out on 04/07/2018 showed no pathogenic mutations.  The Multi-Cancer Panel offered by [facility] includes sequencing and/or deletion duplication testing of the following 90 genes: AIP, ALK, APC, ATM, AXIN2, BAP1, BARD1, BLM, BMPR1A, BRCA1, BRCA2, BRIP1,  BUB1B, CASR, CDC73, CDH1, CDK4, CDKN1B, CDKN1C, CDKN2A, CEBPA, CHEK2, CTNNA1, DICER1, DIS3L2, EGFR, ENG, EPCAM, FH, FLCN, GALNT12, GATA2, GPC3, GREM1, HOXB13, HRAS, KIT, MAX, MEN1, MET, MITF, MLH1, MLH3, MSH2, MSH3, MSH6, MUTYH, NBN, NF1, NF2, NTHL1, PALB2, PDGFRA, PHOX2B, PMS2, POLD1, POLE, POT1, PRKAR1A, PTCH1, PTEN, RAD50, RAD51C, RAD51D, RB1, RECQL4, RET, RNF43, RPS20, RUNX1, SDHA, SDHAF2, SDHB, SDHC, SDHD, SMAD4, SMARCA4, SMARCB1, SMARCE1, STK11, SUFU, TERC, TERT, TMEM127, TP53, TSC1, TSC2, VHL, WRN, WT1  A variant of uncertain significance (VUS) in a gene called PRKAR1A was also noted. [id number] ([id number])  The test report will be scanned into EPIC and will be located under the Molecular Pathology section of the Results Review tab. A portion of the result report is included below for reference.     We discussed with Ms. Mednick that because current genetic testing is not perfect, it is possible there may be a gene mutation in one of these genes that current testing cannot detect, but that chance is small.  We also discussed, that there could be another gene that has not yet been discovered, or that we have not yet tested, that is responsible for the cancer diagnoses in the family. It is also possible there is a hereditary cause for the cancer in the family that Ms. Schild did not inherit and therefore was not identified in her testing.  Therefore, it is important to remain in touch with cancer genetics in the future  so that we can continue to offer Ms. Dettloff the most up to date genetic testing.   Regarding the VUS in PRKAR1A: At this time, it is unknown if this variant is associated with increased cancer risk or if this is a normal finding, but most variants such as this get reclassified to being inconsequential. It should not be used to make medical management decisions. With time, we suspect the lab will determine the significance of this variant, if any. If we do learn more about it, we will  try to contact Ms. Chernick to discuss it further. However, it is important to stay in touch with [place] periodically and keep the address and phone number up to date.  ADDITIONAL GENETIC TESTING: We discussed with Ms. Shepperson that her genetic testing was fairly extensive.  If there are are genes identified to increase cancer risk that can be analyzed in the future, we would be happy to discuss and coordinate this testing at that time.    CANCER SCREENING RECOMMENDATIONS: Ms. Jungman test result is considered negative (normal).  This means that we have not identified a hereditary predisposition to cancer in her at this time.   While reassuring, this does not definitively rule out a hereditary risk for cancer. It is still possible that there could be genetic mutations that are undetectable by current technology, or genetic mutations in genes that have not been tested or identified to increase cancer risk.  Therefore, it is recommended she continue to follow the cancer management and screening guidelines provided by her oncology and primary healthcare provider. An individual's cancer risk is not determined by genetic test results alone.  Overall cancer risk assessment includes additional factors such as personal medical history, family history, etc.  These should be used to make a personalized plan for cancer prevention and surveillance.    Based on the patient's personal and family history, the statistical model (Tyrer c=Cusik)   Was used to estimate her risk of developing breast cancer. This estimates her lifetime risk of developing breast cancer to be approximately 16.1% The patient's lifetime breast cancer risk is a preliminary estimate based on available information using one of several models endorsed by the [place] (ACS). The ACS recommends consideration of breast MRI screening as an adjunct to mammography for patients at high risk (defined as 20% or greater lifetime risk). A more detailed  breast cancer risk assessment can be considered, if clinically indicated.    RECOMMENDATIONS FOR FAMILY MEMBERS:  Relatives in this family might be at some increased risk of developing cancer, over the general population risk, simply due to the family history of cancer.  We recommended women in this family have a yearly mammogram beginning at age 36, or 7 years younger than the earliest onset of cancer, an annual clinical breast exam, and perform monthly breast self-exams. Women in this family should also have a gynecological exam as recommended by their primary provider. All family members should have a colonoscopy by age 4 (or as directed by their doctors).  All family members should inform their physicians about the family history of cancer so their doctors can make the most appropriate screening recommendations for them.   It is also possible there is a hereditary cause for the cancer in Ms. Tess's family that she did not inherit and therefore was not identified in her.  Therefore, we recommended relatives also consider genetic counseling and testing. Ms. Heckart will let us know if we can be of any assistance  in coordinating genetic counseling and/or testing for these family members.   FOLLOW-UP: Lastly, we discussed with Ms. Blanchard that cancer genetics is a rapidly advancing field and it is possible that new genetic tests will be appropriate for her and/or her family members in the future. We encouraged her to remain in contact with cancer genetics on an annual basis so we can update her personal and family histories and let her know of advances in cancer genetics that may benefit this family.   Our contact number was provided. Ms. Buell questions were answered to her satisfaction, and she knows she is welcome to call us at anytime with additional questions or concerns.   Ferol Luz, MS, [facility] Certified Genetic Counselor [email or web address]

## 2018-05-12 DIAGNOSIS — D485 Neoplasm of uncertain behavior of skin: Secondary | ICD-10-CM | POA: Diagnosis not present

## 2018-05-12 DIAGNOSIS — L905 Scar conditions and fibrosis of skin: Secondary | ICD-10-CM | POA: Diagnosis not present

## 2018-07-15 DIAGNOSIS — F17200 Nicotine dependence, unspecified, uncomplicated: Secondary | ICD-10-CM | POA: Diagnosis not present

## 2018-07-15 DIAGNOSIS — I10 Essential (primary) hypertension: Secondary | ICD-10-CM | POA: Diagnosis not present

## 2019-01-11 ENCOUNTER — Other Ambulatory Visit: Payer: Self-pay | Admitting: Family Medicine

## 2019-01-11 DIAGNOSIS — Z87891 Personal history of nicotine dependence: Secondary | ICD-10-CM

## 2019-01-11 DIAGNOSIS — R079 Chest pain, unspecified: Secondary | ICD-10-CM

## 2019-01-26 ENCOUNTER — Other Ambulatory Visit: Payer: 59

## 2019-02-01 ENCOUNTER — Other Ambulatory Visit: Payer: Self-pay

## 2019-02-01 ENCOUNTER — Ambulatory Visit
Admission: RE | Admit: 2019-02-01 | Discharge: 2019-02-01 | Disposition: A | Discharge status: Home / Self Care | Payer: 59 | Source: Ambulatory Visit | Attending: Family Medicine | Admitting: Family Medicine

## 2019-02-01 DIAGNOSIS — Z87891 Personal history of nicotine dependence: Secondary | ICD-10-CM

## 2019-02-01 DIAGNOSIS — R079 Chest pain, unspecified: Secondary | ICD-10-CM

## 2019-07-28 ENCOUNTER — Other Ambulatory Visit: Payer: Self-pay | Admitting: Family Medicine

## 2019-07-28 ENCOUNTER — Other Ambulatory Visit: Payer: Self-pay

## 2019-07-28 ENCOUNTER — Ambulatory Visit
Admission: RE | Admit: 2019-07-28 | Discharge: 2019-07-28 | Disposition: A | Discharge status: Home / Self Care | Payer: 59 | Source: Ambulatory Visit | Attending: Family Medicine | Admitting: Family Medicine

## 2019-07-28 DIAGNOSIS — R109 Unspecified abdominal pain: Secondary | ICD-10-CM

## 2019-07-28 DIAGNOSIS — R110 Nausea: Secondary | ICD-10-CM

## 2019-08-01 ENCOUNTER — Other Ambulatory Visit: Payer: Self-pay | Admitting: Family Medicine

## 2019-08-01 DIAGNOSIS — R911 Solitary pulmonary nodule: Secondary | ICD-10-CM

## 2019-08-16 ENCOUNTER — Ambulatory Visit
Admission: RE | Admit: 2019-08-16 | Discharge: 2019-08-16 | Disposition: A | Discharge status: Home / Self Care | Payer: 59 | Source: Ambulatory Visit | Attending: Family Medicine | Admitting: Family Medicine

## 2019-08-16 DIAGNOSIS — R911 Solitary pulmonary nodule: Secondary | ICD-10-CM

## 2019-08-17 ENCOUNTER — Other Ambulatory Visit: Payer: 59

## 2019-10-12 ENCOUNTER — Ambulatory Visit (INDEPENDENT_AMBULATORY_CARE_PROVIDER_SITE_OTHER): Payer: 59 | Admitting: Cardiology

## 2019-10-12 ENCOUNTER — Other Ambulatory Visit: Payer: Self-pay

## 2019-10-12 VITALS — BP 130/76 | HR 100 | Temp 98.5°F | Ht 65.0 in | Wt 193.8 lb

## 2019-10-12 DIAGNOSIS — Z716 Tobacco abuse counseling: Secondary | ICD-10-CM | POA: Diagnosis not present

## 2019-10-12 DIAGNOSIS — Z79899 Other long term (current) drug therapy: Secondary | ICD-10-CM

## 2019-10-12 DIAGNOSIS — I700 Atherosclerosis of aorta: Secondary | ICD-10-CM | POA: Diagnosis not present

## 2019-10-12 DIAGNOSIS — Z7189 Other specified counseling: Secondary | ICD-10-CM

## 2019-10-12 DIAGNOSIS — I2510 Atherosclerotic heart disease of native coronary artery without angina pectoris: Secondary | ICD-10-CM | POA: Insufficient documentation

## 2019-10-12 DIAGNOSIS — E7800 Pure hypercholesterolemia, unspecified: Secondary | ICD-10-CM

## 2019-10-12 MED ORDER — ROSUVASTATIN CALCIUM 10 MG PO TABS: 10.0000 mg | ORAL_TABLET | Freq: Every day | ORAL | 3 refills | Status: DC

## 2019-10-12 NOTE — Patient Instructions (Addendum)
Medication Instructions:  Stop Simvastatin 20 mg daily Start Rosuvastatin 10 mg daily  *If you need a refill on your cardiac medications before your next appointment, please call your pharmacy*   Lab Work: Your physician recommends that you return for lab work in 2 months ( Fasting lipids, LFT)  If you have labs (blood work) drawn today and your tests are completely normal, you will receive your results only by: Marland Kitchen MyChart Message (if you have MyChart) OR . A paper copy in the mail If you have any lab test that is abnormal or we need to change your treatment, we will call you to review the results.   Testing/Procedures: None   Follow-Up: At [facility], you and your health needs are our priority.  As part of our continuing mission to provide you with exceptional heart care, we have created designated Provider Care Teams.  These Care Teams include your primary Cardiologist (physician) and Advanced Practice Providers (APPs -  Physician Assistants and Nurse Practitioners) who all work together to provide you with the care you need, when you need it.  We recommend signing up for the patient portal called "MyChart".  Sign up information is provided on this After Visit Summary.  MyChart is used to connect with patients for Virtual Visits (Telemedicine).  Patients are able to view lab/test results, encounter notes, upcoming appointments, etc.  Non-urgent messages can be sent to your provider as well.   To learn more about what you can do with MyChart, go to [email or web address].    Your next appointment:   1 year(s)  The format for your next appointment:   In Person  Provider:   Buford Dresser, MD

## 2019-10-12 NOTE — Progress Notes (Signed)
Cardiology Office Note:    Date:  10/12/2019   ID:  Rachel, Bell 08-Aug-1965, MRN [id number]  PCP:  Lujean Amel, MD  Cardiologist:  Buford Dresser, MD  Referring MD: Lujean Amel, MD   CC: consultation for aortic atherosclerosis and coronary calcification  History of Present Illness:    Rachel Bell is a 54 y.o. female with a hx of hypertension, hyperlipidemia, tobacco use, fatty liver who is seen as a new consult at the request of Rachel, Dibas, MD for the evaluation and management of aortic atherosclerosis and coronary calcification.  Today: Had CT scan done, sent to cardiology based on results but she isn't sure why. Wasn't told exactly what the scan meant.  Cardiovascular risk factors: Prior clinical ASCVD: none Comorbid conditions: Endorses hypertension, hyperlipidemia, fatty liver. Denies diabetes, chronic kidney disease  Metabolic syndrome/Obesity: currently at highest adult weight. Chronic inflammatory conditions: none, though was tested for rheumatoid factor in the past (told it was negative) Tobacco use history: currently smoking, 3/4-1 ppd for about 35 years. Has quit several times in the past, used chantix twice. Family history: she is adopted, but she is aware of some of her family history. Maternal grandmother had heart issues, died in late 40s/early 58s.Mother died of pelvic cancer in young middle age. Paternal side with longevity. Prior cardiac testing and/or incidental findings on other testing (ie coronary calcium): aortic atherosclerosis and coronary calcium as above. Exercise level: minimal intentional exercise, but able to walk without limitations. Did water aerobics this summer on occasion Current diet: not strict, does occasionally overindulge. Working on improving.  Per records: Last lipids 01/05/19 Tchol 168, HDL 47, TG 93, LDL 102  Just went back on simvastatin recently, had been held for a time while liver workup was ongoing.  Went back on earlier this year, had been on total for 2-3 years. Has never been on other med.   Discussed warning signs of a heart attack.  Denies chest pain, shortness of breath at rest or with normal exertion. No PND, orthopnea, LE edema or unexpected weight gain. No syncope, rare palpitations.   Past Medical History:  Diagnosis Date  . Anxiety   . Arthritis   . Cancer ([place])    skin cancer  . Depression   . Family history of breast cancer   . Hemorrhoids   . Hyperlipidemia   . Hypertension     Past Surgical History:  Procedure Laterality Date  . CESAREAN SECTION    . COLONOSCOPY     > 12 yrs in [place]- pt cannot find records or who   . MYOMECTOMY    . PARTIAL HYSTERECTOMY      Current Medications: Current Outpatient Medications on File Prior to Visit  Medication Sig  . buPROPion (WELLBUTRIN SR) 150 MG 12 hr tablet Take 150 mg by mouth daily.   . hydrochlorothiazide (HYDRODIURIL) 25 MG tablet Take 25 mg by mouth daily.  . Multiple Vitamin (MULITIVITAMIN WITH MINERALS) TABS Take 1 tablet by mouth daily.  Marland Kitchen PRESCRIPTION MEDICATION Apply 1 patch topically every 3 (three) days. Wed and sat. vivelle dot micro.  . simvastatin (ZOCOR) 20 MG tablet Take 20 mg by mouth daily.   No current facility-administered medications on file prior to visit.     Allergies:   Patient has no known allergies.   Social History   Tobacco Use  . Smoking status: Current Every Day Smoker    Packs/day: 1.00  . Smokeless tobacco: Never Used  Substance Use Topics  .  Alcohol use: Yes    Alcohol/week: 0.0 standard drinks    Comment: socially  . Drug use: No    Family History: family history includes Breast cancer (age of onset: 68) in her half-sister; Cancer (age of onset: 61) in her mother. There is no history of Colon cancer, Colon polyps, Rectal cancer, Stomach cancer, or Esophageal cancer. She was adopted.  ROS:   Please see the history of present illness.  Additional pertinent  ROS: Constitutional: Negative for chills, fever, night sweats, unintentional weight loss  HENT: Negative for ear pain and hearing loss.   Eyes: Negative for loss of vision and eye pain.  Respiratory: Negative for cough, sputum, wheezing.   Cardiovascular: See HPI. Gastrointestinal: Negative for abdominal pain, melena, and hematochezia.  Genitourinary: Negative for dysuria and hematuria.  Musculoskeletal: Negative for falls and myalgias.  Skin: Negative for itching and rash.  Neurological: Negative for focal weakness, focal sensory changes and loss of consciousness.  Endo/Heme/Allergies: Does not bruise/bleed easily.     EKGs/Labs/Other Studies Reviewed:    The following studies were reviewed today: CT chest 08/17/19 FINDINGS: Cardiovascular: Aortic atherosclerosis. Normal heart size, without pericardial effusion. Lad coronary artery calcification, including on 69/2 and 75/2.  Mediastinum/Nodes: No mediastinal or definite hilar adenopathy, given limitations of unenhanced CT.  Lungs/Pleura: No pleural fluid. Subpleural right upper lobe 4 mm nodule on 59/8, similar.  Nodule along the right minor fissure is similar at 4 mm on 67/8.  Upper Abdomen: Moderate to marked hepatic steatosis. Normal imaged portions of the spleen, stomach, pancreas, adrenal glands, kidneys.  Musculoskeletal: Mild mid thoracic spondylosis.  IMPRESSION: 1. Perifissural nodules in the right upper and middle lobes are most consistent with subpleural lymph nodes. These are unchanged. 2. Age advanced coronary artery atherosclerosis. Recommend assessment of coronary risk factors and consideration of medical therapy. 3. Hepatic steatosis.  EKG:  EKG is personally reviewed.  The ekg ordered today demonstrates normal sinus rhythm at 100 bpm  Recent Labs: No results found for requested labs within last 8760 hours.  Recent Lipid Panel No results found for: CHOL, TRIG, HDL, CHOLHDL, VLDL, LDLCALC,  LDLDIRECT  Physical Exam:    VS:  BP 130/76   Pulse 100   Temp 98.5 F (36.9 C)   Ht 5\' 5"  (1.651 m)   Wt 193 lb 12.8 oz (87.9 kg)   SpO2 98%   BMI 32.25 kg/m     Wt Readings from Last 3 Encounters:  10/12/19 193 lb 12.8 oz (87.9 kg)  10/10/15 181 lb 9.6 oz (82.4 kg)  10/04/15 181 lb 9.6 oz (82.4 kg)    GEN: Well nourished, well developed in no acute distress HEENT: Normal, moist mucous membranes NECK: No JVD CARDIAC: regular rhythm in a pattern of trigeminy, normal S1 and S2, no rubs or gallops. No murmurs. VASCULAR: Radial and DP pulses 2+ bilaterally. No carotid bruits RESPIRATORY:  Clear to auscultation without rales, wheezing or rhonchi  ABDOMEN: Soft, non-tender, non-distended MUSCULOSKELETAL:  Ambulates independently SKIN: Warm and dry, no edema NEUROLOGIC:  Alert and oriented x 3. No focal neuro deficits noted. PSYCHIATRIC:  Normal affect    ASSESSMENT:    1. Coronary artery calcification seen on CT scan   2. Aortic atherosclerosis ([place])   3. Tobacco abuse counseling   4. Pure hypercholesterolemia   5. Medication management   6. Cardiac risk counseling   7. Counseling on health promotion and disease prevention    PLAN:    Aortic atherosclerosis Coronary artery calcification -discussed  ASCVD today, risk, how it is managed -will change simvastatin to rosuvastatin -discussed importance of tobacco cessation -discussed aspirin -reviewed red flag warning signs and when to see immediate medical attention  Hypertension: just at goal of 130/80 today -continue HCTZ  Hypercholesterolemia: -as above, change simvastatin to rosuvastatin, recheck labs -hepatic steatosis noted on CT  Tobacco cessation: The patient was counseled on tobacco cessation today for 4 minutes.  Counseling included reviewing the risks of smoking tobacco products, how it impacts the patient's current medical diagnoses and different strategies for quitting.  Pharmacotherapy to aid in tobacco  cessation was not prescribed today.  Cardiac risk counseling and prevention recommendations: -recommend heart healthy/Mediterranean diet, with whole grains, fruits, vegetable, fish, lean meats, nuts, and olive oil. Limit salt. -recommend moderate walking, 3-5 times/week for 30-50 minutes each session. Aim for at least 150 minutes.week. Goal should be pace of 3 miles/hours, or walking 1.5 miles in 30 minutes -recommend avoidance of tobacco products. Avoid excess alcohol. -Additional risk factor control:  -Diabetes risk: A1c is not available  -Weight: BMI 32  Plan for follow up: 1 year or sooner as needed  Buford Dresser, MD, PhD [place]  [facility]    Medication Adjustments/Labs and Tests Ordered: Current medicines are reviewed at length with the patient today.  Concerns regarding medicines are outlined above.  Orders Placed This Encounter  Procedures  . Lipid panel  . Hepatic function panel  . EKG 12-Lead   Meds ordered this encounter  Medications  . rosuvastatin (CRESTOR) 10 MG tablet    Sig: Take 1 tablet (10 mg total) by mouth daily.    Dispense:  90 tablet    Refill:  3    Patient Instructions  Medication Instructions:  Stop Simvastatin 20 mg daily Start Rosuvastatin 10 mg daily  *If you need a refill on your cardiac medications before your next appointment, please call your pharmacy*   Lab Work: Your physician recommends that you return for lab work in 2 months ( Fasting lipids, LFT)  If you have labs (blood work) drawn today and your tests are completely normal, you will receive your results only by: Marland Kitchen MyChart Message (if you have MyChart) OR . A paper copy in the mail If you have any lab test that is abnormal or we need to change your treatment, we will call you to review the results.   Testing/Procedures: None   Follow-Up: At [facility], you and your health needs are our priority.  As part of our continuing mission to provide you with  exceptional heart care, we have created designated Provider Care Teams.  These Care Teams include your primary Cardiologist (physician) and Advanced Practice Providers (APPs -  Physician Assistants and Nurse Practitioners) who all work together to provide you with the care you need, when you need it.  We recommend signing up for the patient portal called "MyChart".  Sign up information is provided on this After Visit Summary.  MyChart is used to connect with patients for Virtual Visits (Telemedicine).  Patients are able to view lab/test results, encounter notes, upcoming appointments, etc.  Non-urgent messages can be sent to your provider as well.   To learn more about what you can do with MyChart, go to [email or web address].    Your next appointment:   1 year(s)  The format for your next appointment:   In Person  Provider:   Buford Dresser, MD     Signed, Buford Dresser, MD PhD 10/12/2019  [place] [facility] 

## 2019-12-16 ENCOUNTER — Encounter: Payer: Self-pay | Admitting: Cardiology

## 2020-01-15 ENCOUNTER — Other Ambulatory Visit (HOSPITAL_COMMUNITY): Payer: Self-pay | Admitting: Pharmacist

## 2020-01-15 DIAGNOSIS — E7800 Pure hypercholesterolemia, unspecified: Secondary | ICD-10-CM

## 2020-01-15 DIAGNOSIS — I700 Atherosclerosis of aorta: Secondary | ICD-10-CM

## 2020-01-15 DIAGNOSIS — I2510 Atherosclerotic heart disease of native coronary artery without angina pectoris: Secondary | ICD-10-CM

## 2020-01-15 MED ORDER — ROSUVASTATIN CALCIUM 10 MG PO TABS: 10.0000 mg | ORAL_TABLET | Freq: Every day | ORAL | 3 refills | Status: DC

## 2020-02-08 IMAGING — CT CT CHEST W/O CM
1 series · 15 of 34 positions shown, 19 images · non-contrast
Comparison: 11/09/2018 rib series

CLINICAL DATA: Left-sided chest pain for several months

EXAM:
CT CHEST WITHOUT CONTRAST
TECHNIQUE: Multidetector CT imaging of the chest was performed following the
standard protocol without IV contrast.

[Series 2: chest w/(date) · axial · 0.75mm/px · z∈[-306,-46]mm · 15 of 154 slices shown, 19 images]
[im 12/154  mediastinal]
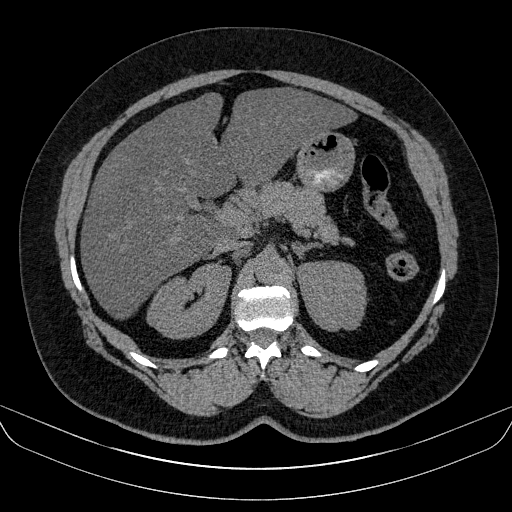
[im 12/154  lung]
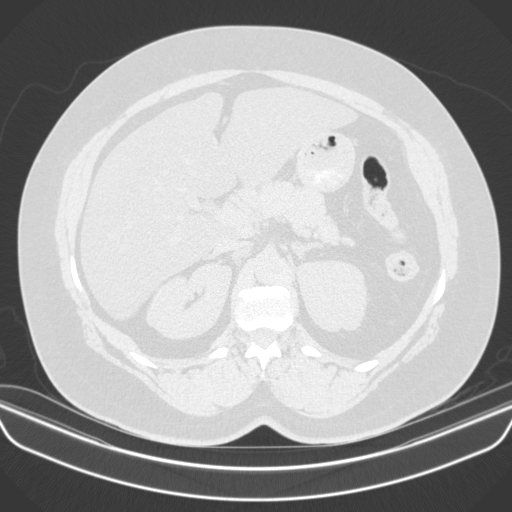
[im 23/154  lung]
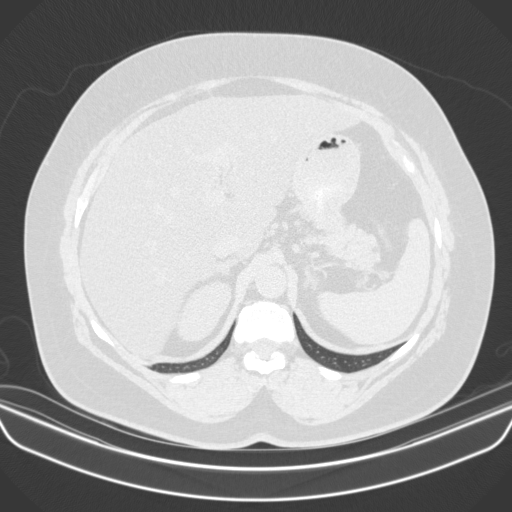
[im 31/154  lung]
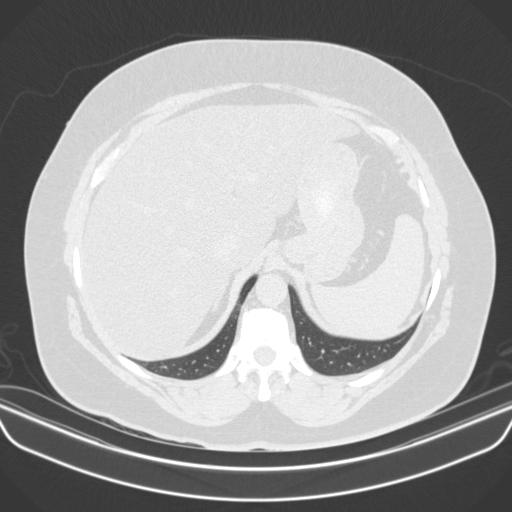
[im 40/154  lung]
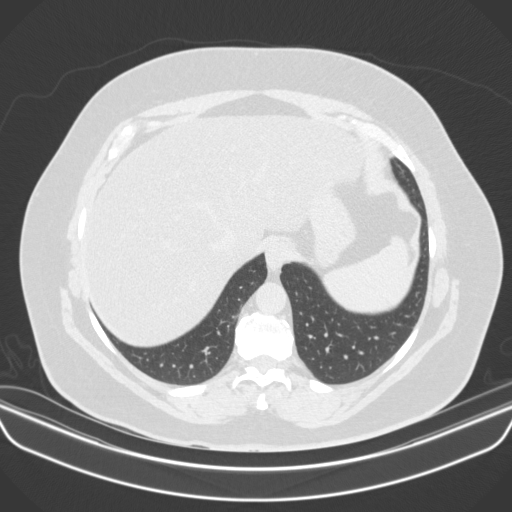
[im 52/154  mediastinal]
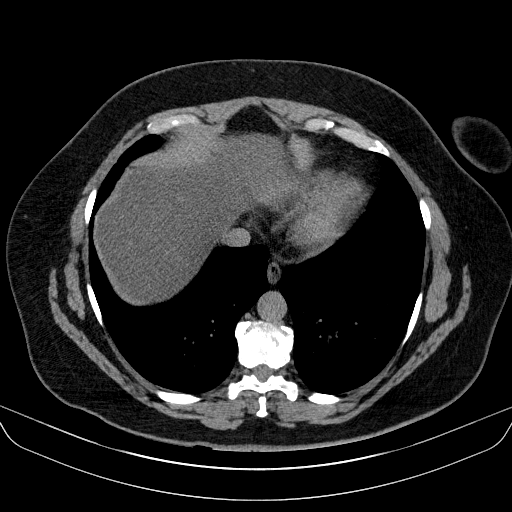
[im 52/154  lung]
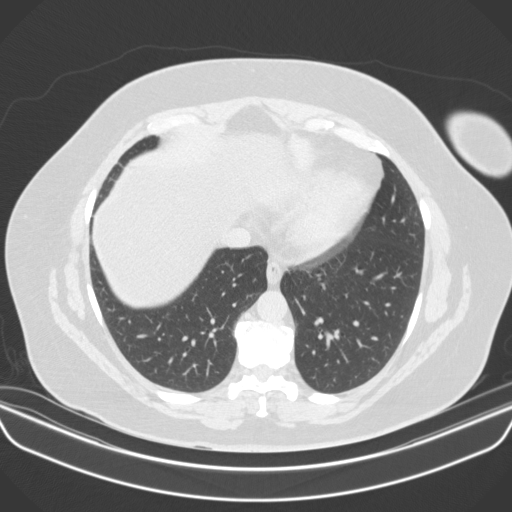
[im 62/154  lung]
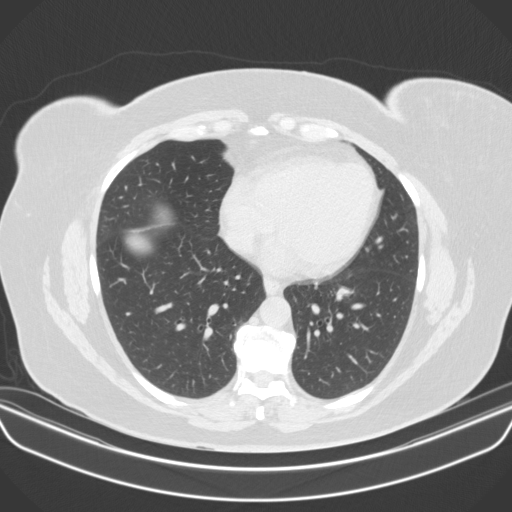
[im 69/154  lung]
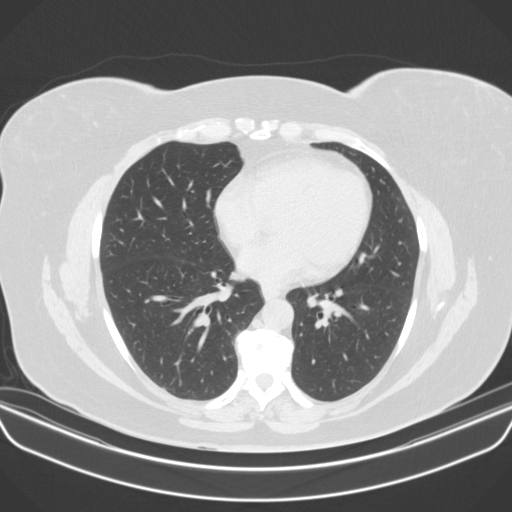
[im 80/154  lung]
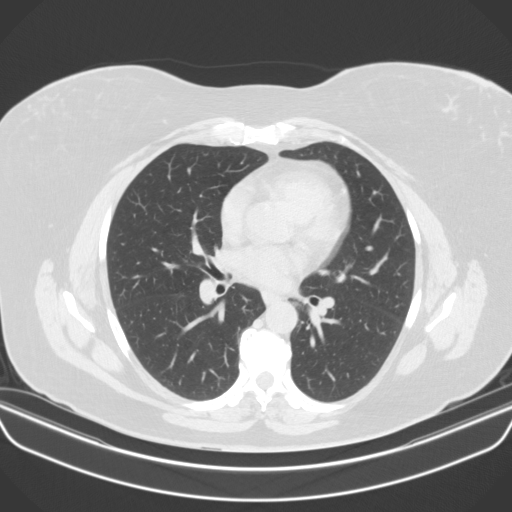
[im 86/154  mediastinal]
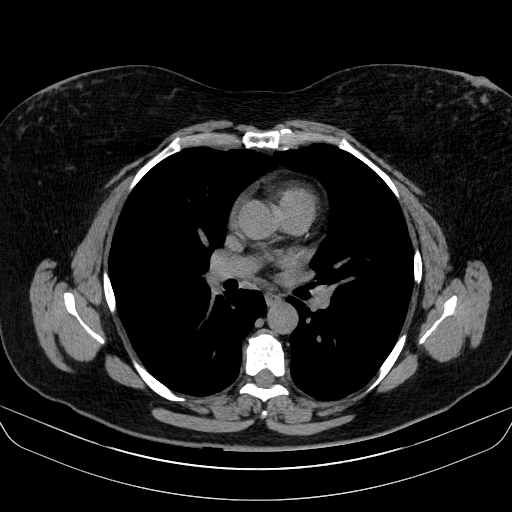
[im 86/154  lung]
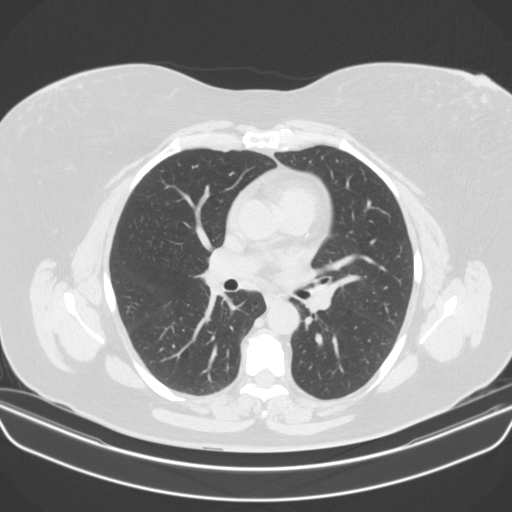
[im 92/154  lung]
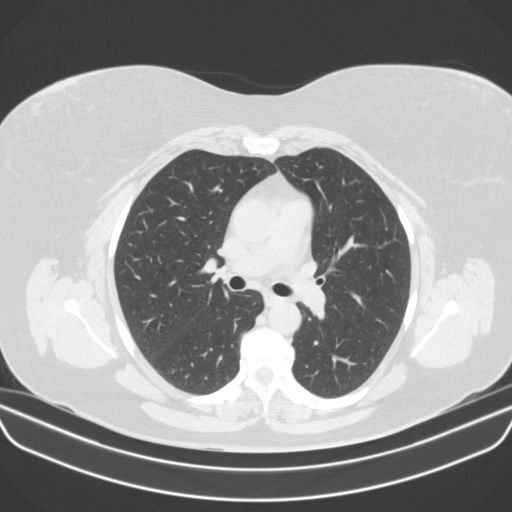
[im 103/154  lung]
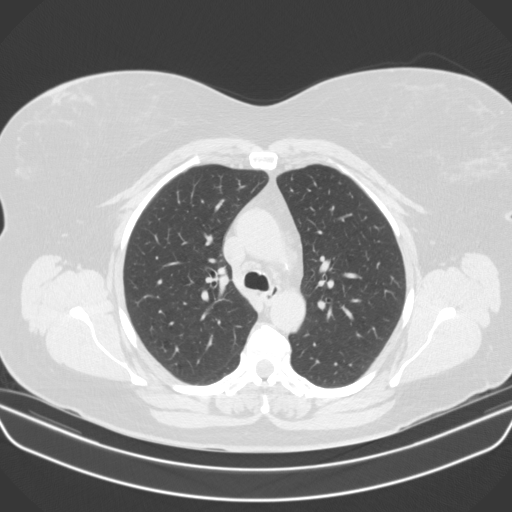
[im 114/154  lung]
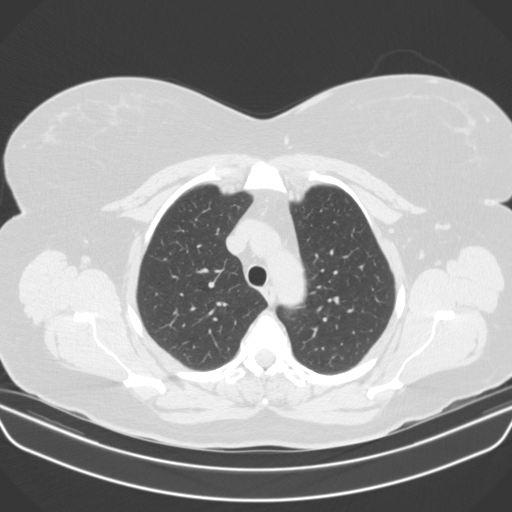
[im 123/154  mediastinal]
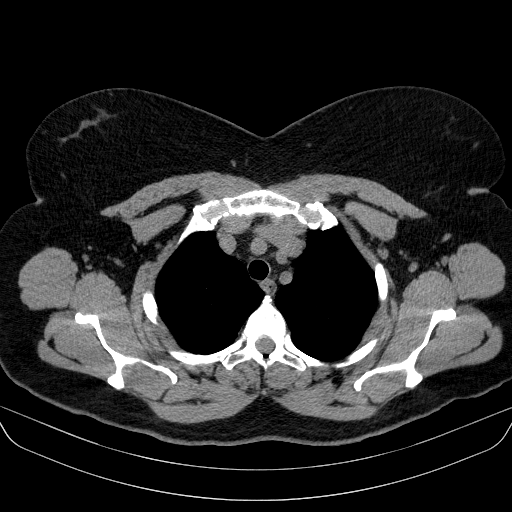
[im 123/154  lung]
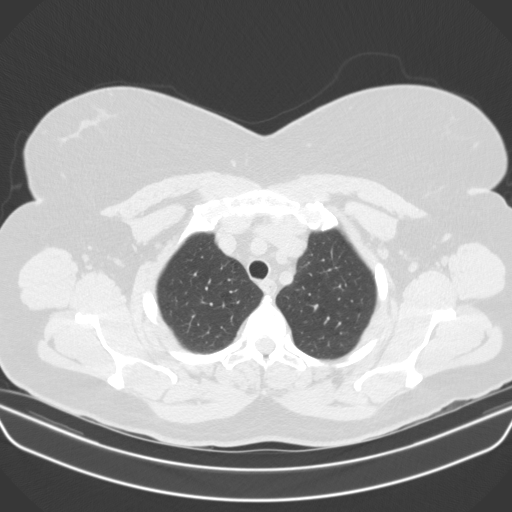
[im 131/154  lung]
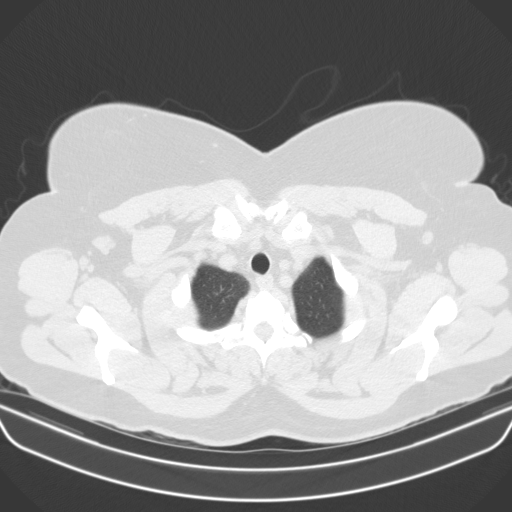
[im 142/154  lung]
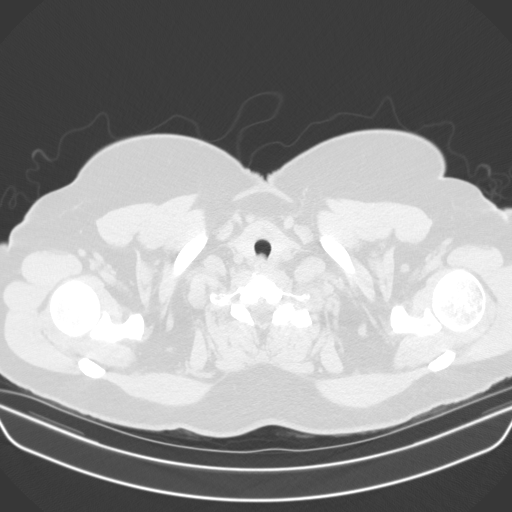

[15 of 34 positions shown; findings below may reference images not displayed]

FINDINGS: Cardiovascular: Somewhat limited due to lack of IV contrast.
Atherosclerotic calcifications of the aorta are noted. No aneurysmal
dilatation is seen. No cardiac enlargement is noted. No significant
coronary calcifications are seen.

Mediastinum/Nodes: Thoracic inlet is within normal limits. No hilar
or mediastinal adenopathy is noted. The esophagus as visualized is
within normal limits.

Lungs/Pleura: Lungs are well aerated bilaterally. No focal
infiltrate or sizable effusion is seen. A small less than 5 mm
nodule is noted along the minor fissure on the right best seen on
image number 68 of series 3. Additionally a tiny subpleural nodule
is noted on image number 61 of series 3 also in the right upper
lobe. No other significant nodules are seen.

Upper Abdomen: Visualized upper abdomen demonstrates diffuse fatty
infiltration of the liver. No other focal abnormality is noted.

Musculoskeletal: Degenerative changes of the thoracic spine are
noted.
IMPRESSION: Small less than 5 mm nodules identified within the right lung as
described. No follow-up needed if patient is low-risk (and has no
known or suspected primary neoplasm). Non-contrast chest CT can be
considered in 12 months if patient is high-risk. This recommendation
follows the consensus statement: Guidelines for Management of
Incidental Pulmonary Nodules Detected on CT Images: From the

No specific bony abnormality is identified correspond with the
patient's given clinical history.

Fatty infiltration of the liver.

Aortic Atherosclerosis ([id number]-501.1).

## 2020-08-10 ENCOUNTER — Other Ambulatory Visit: Payer: Self-pay | Admitting: Family Medicine

## 2020-08-10 DIAGNOSIS — F17200 Nicotine dependence, unspecified, uncomplicated: Secondary | ICD-10-CM

## 2020-08-10 DIAGNOSIS — F17210 Nicotine dependence, cigarettes, uncomplicated: Secondary | ICD-10-CM

## 2020-08-21 ENCOUNTER — Other Ambulatory Visit: Payer: Self-pay | Admitting: Cardiology

## 2020-08-21 DIAGNOSIS — E7800 Pure hypercholesterolemia, unspecified: Secondary | ICD-10-CM

## 2020-08-21 DIAGNOSIS — I2510 Atherosclerotic heart disease of native coronary artery without angina pectoris: Secondary | ICD-10-CM

## 2020-08-21 DIAGNOSIS — I700 Atherosclerosis of aorta: Secondary | ICD-10-CM

## 2020-10-05 ENCOUNTER — Ambulatory Visit
Admission: RE | Admit: 2020-10-05 | Discharge: 2020-10-05 | Disposition: A | Discharge status: Home / Self Care | Payer: 59 | Source: Ambulatory Visit | Attending: Family Medicine | Admitting: Family Medicine

## 2020-10-05 DIAGNOSIS — F17200 Nicotine dependence, unspecified, uncomplicated: Secondary | ICD-10-CM

## 2020-10-05 DIAGNOSIS — F17210 Nicotine dependence, cigarettes, uncomplicated: Secondary | ICD-10-CM

## 2020-10-24 ENCOUNTER — Other Ambulatory Visit: Payer: Self-pay | Admitting: Cardiology

## 2020-10-24 DIAGNOSIS — I2510 Atherosclerotic heart disease of native coronary artery without angina pectoris: Secondary | ICD-10-CM

## 2020-10-24 DIAGNOSIS — I700 Atherosclerosis of aorta: Secondary | ICD-10-CM

## 2020-10-24 DIAGNOSIS — E7800 Pure hypercholesterolemia, unspecified: Secondary | ICD-10-CM

## 2020-11-29 ENCOUNTER — Other Ambulatory Visit: Payer: Self-pay

## 2020-11-29 ENCOUNTER — Encounter (HOSPITAL_BASED_OUTPATIENT_CLINIC_OR_DEPARTMENT_OTHER): Payer: Self-pay | Admitting: Cardiology

## 2020-11-29 ENCOUNTER — Ambulatory Visit (INDEPENDENT_AMBULATORY_CARE_PROVIDER_SITE_OTHER): Payer: 59 | Admitting: Cardiology

## 2020-11-29 VITALS — BP 138/74 | HR 92 | Ht 65.0 in | Wt 192.4 lb

## 2020-11-29 DIAGNOSIS — R7309 Other abnormal glucose: Secondary | ICD-10-CM

## 2020-11-29 DIAGNOSIS — I700 Atherosclerosis of aorta: Secondary | ICD-10-CM

## 2020-11-29 DIAGNOSIS — Z716 Tobacco abuse counseling: Secondary | ICD-10-CM

## 2020-11-29 DIAGNOSIS — I2510 Atherosclerotic heart disease of native coronary artery without angina pectoris: Secondary | ICD-10-CM | POA: Diagnosis not present

## 2020-11-29 MED ORDER — ASPIRIN EC 81 MG PO TBEC: 81.0000 mg | DELAYED_RELEASE_TABLET | Freq: Every day | ORAL | 3 refills | Status: AC

## 2020-11-29 NOTE — Progress Notes (Signed)
Cardiology Office Note:    Date:  11/29/2020   ID:  Rachel Bell, Rachel Bell Mar 09, 1966, MRN [id number]  PCP:  Lujean Amel, MD  Cardiologist:  Buford Dresser, MD  Referring MD: Lujean Amel, MD   CC: follow up  History of Present Illness:    Rachel Bell is a 55 y.o. female with a hx of hypertension, hyperlipidemia, tobacco use, fatty liver who is seen for follow up. I initially met her 10/12/19 as a new consult at the request of Koirala, Dibas, MD for the evaluation and management of aortic atherosclerosis and coronary calcification.  Today: Had CT scan done, sent to cardiology based on results but she isn't sure why. Wasn't told exactly what the scan meant.  Cardiovascular risk factors: Comorbid conditions: Endorses hypertension, hyperlipidemia, fatty liver. Denies diabetes, chronic kidney disease  Metabolic syndrome/Obesity: currently at highest adult weight.  Today: No acute events.  Still smoking just under 1 ppd, Chantix is now off the market. Going to work on getting a nicotine patch.  No routine exercise, but has an elliptical in the garage she wants to start using. Climbs stairs in her house regularly, but sits most of the day for her job.   Diet has not been great, wants to be better but struggling. Fast food about once/week.   Past Medical History:  Diagnosis Date   Anxiety    Arthritis    Cancer ([place])    skin cancer   Depression    Family history of breast cancer    Hemorrhoids    Hyperlipidemia    Hypertension     Past Surgical History:  Procedure Laterality Date   CESAREAN SECTION     COLONOSCOPY     > 12 yrs in [place]- pt cannot find records or who    MYOMECTOMY     PARTIAL HYSTERECTOMY      Current Medications: Current Outpatient Medications on File Prior to Visit  Medication Sig   buPROPion (WELLBUTRIN SR) 150 MG 12 hr tablet Take 150 mg by mouth daily.    hydrochlorothiazide (HYDRODIURIL) 25 MG tablet Take 25 mg by mouth  daily.   Multiple Vitamin (MULITIVITAMIN WITH MINERALS) TABS Take 1 tablet by mouth daily.   PRESCRIPTION MEDICATION Apply 1 patch topically every 3 (three) days. Wed and sat. vivelle dot micro.   rosuvastatin (CRESTOR) 10 MG tablet Take 1 tablet (10 mg total) by mouth daily. Need Appointment   No current facility-administered medications on file prior to visit.     Allergies:   Patient has no known allergies.   Social History   Tobacco Use   Smoking status: Every Day    Packs/day: 1.00    Types: Cigarettes   Smokeless tobacco: Never  Substance Use Topics   Alcohol use: Yes    Alcohol/week: 0.0 standard drinks    Comment: socially   Drug use: No    Family History: family history includes Breast cancer (age of onset: 68) in her half-sister; Cancer (age of onset: 13) in her mother. There is no history of Colon cancer, Colon polyps, Rectal cancer, Stomach cancer, or Esophageal cancer. She was adopted. she is adopted, but she is aware of some of her family history. Maternal grandmother had heart issues, died in late 40s/early 28s.Mother died of pelvic cancer in young middle age. Paternal side with longevity.  ROS:   Please see the history of present illness.  Additional pertinent ROS otherwise unremarkable.  EKGs/Labs/Other Studies Reviewed:    The following studies  were reviewed today: CT chest 08/17/19 FINDINGS: Cardiovascular: Aortic atherosclerosis. Normal heart size, without pericardial effusion. Lad coronary artery calcification, including on 69/2 and 75/2.   Mediastinum/Nodes: No mediastinal or definite hilar adenopathy, given limitations of unenhanced CT.   Lungs/Pleura: No pleural fluid. Subpleural right upper lobe 4 mm nodule on 59/8, similar.   Nodule along the right minor fissure is similar at 4 mm on 67/8.   Upper Abdomen: Moderate to marked hepatic steatosis. Normal imaged portions of the spleen, stomach, pancreas, adrenal glands, kidneys.   Musculoskeletal:  Mild mid thoracic spondylosis.   IMPRESSION: 1. Perifissural nodules in the right upper and middle lobes are most consistent with subpleural lymph nodes. These are unchanged. 2. Age advanced coronary artery atherosclerosis. Recommend assessment of coronary risk factors and consideration of medical therapy. 3. Hepatic steatosis.  EKG:  EKG is personally reviewed.   11/29/20 NSR 92bpm, low anterior forces  Recent Labs: No results found for requested labs within last 8760 hours.  Recent Lipid Panel No results found for: CHOL, TRIG, HDL, CHOLHDL, VLDL, LDLCALC, LDLDIRECT  Physical Exam:    VS:  BP 138/74   Pulse 92   Ht 5' 5"  (1.651 m)   Wt 192 lb 6.4 oz (87.3 kg)   SpO2 95%   BMI 32.02 kg/m     Wt Readings from Last 3 Encounters:  11/29/20 192 lb 6.4 oz (87.3 kg)  10/12/19 193 lb 12.8 oz (87.9 kg)  10/10/15 181 lb 9.6 oz (82.4 kg)    GEN: Well nourished, well developed in no acute distress HEENT: Normal, moist mucous membranes NECK: No JVD CARDIAC: regular rhythm, normal S1 and S2, no rubs or gallops. No murmur. VASCULAR: Radial and DP pulses 2+ bilaterally. No carotid bruits RESPIRATORY:  Clear to auscultation without rales, wheezing or rhonchi  ABDOMEN: Soft, non-tender, non-distended MUSCULOSKELETAL:  Ambulates independently SKIN: Warm and dry, no edema NEUROLOGIC:  Alert and oriented x 3. No focal neuro deficits noted. PSYCHIATRIC:  Normal affect    ASSESSMENT:    1. Tobacco abuse counseling   2. Aortic atherosclerosis ([place])   3. Coronary artery calcification seen on CT scan   4. Abnormal glucose measurement     PLAN:    Aortic atherosclerosis Coronary artery calcification -discussed ASCVD today, risk, how it is managed -tolerating rosuvastatin -lipids from 08/08/20 per [facility]: Tchol 159, HDL 43, LDL 85, TG 178 (not fasting) -discussed importance of tobacco cessation -discussed aspirin today, she is amenable -reviewed red flag warning signs and when to see  immediate medical attention  Hypertension: above goal of 130/80 today, but has been normal on her other doctor's visits recently -continue HCTZ -discussed home cuff, how to check  Hypercholesterolemia: -as above, continue rosuvastatin -hepatic steatosis noted on CT  Abnormal glucose -A1c 6.1 -BMI 32  Tobacco cessation: The patient was counseled on tobacco cessation today for 5 minutes.  Counseling included reviewing the risks of smoking tobacco products, how it impacts the patient's current medical diagnoses and different strategies for quitting.  Pharmacotherapy to aid in tobacco cessation was not prescribed today.   Cardiac risk counseling and prevention recommendations: -recommend heart healthy/Mediterranean diet, with whole grains, fruits, vegetable, fish, lean meats, nuts, and olive oil. Limit salt. -recommend moderate walking, 3-5 times/week for 30-50 minutes each session. Aim for at least 150 minutes.week. Goal should be pace of 3 miles/hours, or walking 1.5 miles in 30 minutes -recommend avoidance of tobacco products. Avoid excess alcohol.  Plan for follow up: 1 year or sooner  as needed  Buford Dresser, MD, PhD [place]  [facility]    Medication Adjustments/Labs and Tests Ordered: Current medicines are reviewed at length with the patient today.  Concerns regarding medicines are outlined above.  Orders Placed This Encounter  Procedures   EKG 12-Lead    Meds ordered this encounter  Medications   aspirin EC 81 MG tablet    Sig: Take 1 tablet (81 mg total) by mouth daily. Swallow whole.    Dispense:  90 tablet    Refill:  3     Patient Instructions  Medication Instructions:  Your Physician recommend you continue on your current medication as directed.    *If you need a refill on your cardiac medications before your next appointment, please call your pharmacy*   Lab Work: None ordered today   Testing/Procedures: None ordered  today   Follow-Up: At [facility], you and your health needs are our priority.  As part of our continuing mission to provide you with exceptional heart care, we have created designated Provider Care Teams.  These Care Teams include your primary Cardiologist (physician) and Advanced Practice Providers (APPs -  Physician Assistants and Nurse Practitioners) who all work together to provide you with the care you need, when you need it.  We recommend signing up for the patient portal called "MyChart".  Sign up information is provided on this After Visit Summary.  MyChart is used to connect with patients for Virtual Visits (Telemedicine).  Patients are able to view lab/test results, encounter notes, upcoming appointments, etc.  Non-urgent messages can be sent to your provider as well.   To learn more about what you can do with MyChart, go to [email or web address].    Your next appointment:   1 year(s)  The format for your next appointment:   In Person  Provider:   Buford Dresser, MD   Other Instructions -how to check blood pressure:  -sit comfortably in a chair, feet uncrossed and flat on floor, for 5-10 minutes  -arm ideally should rest at the level of the heart. However, arm should be relaxed and not tense (for example, do not hold the arm up unsupported)  -avoid exercise, caffeine, and tobacco for at least 30 minutes prior to BP reading  -don't take BP cuff reading over clothes (always place on skin directly)  -I prefer to know how well the medication is working, so I would like you to take your readings 1-2 hours after taking your blood pressure medication if possible  I like arm cuffs over wrist cuffs. Omron is a good brand.  Signed, Buford Dresser, MD PhD 11/29/2020  [facility]

## 2020-11-29 NOTE — Patient Instructions (Addendum)
Medication Instructions:  Your Physician recommend you continue on your current medication as directed.    *If you need a refill on your cardiac medications before your next appointment, please call your pharmacy*   Lab Work: None ordered today   Testing/Procedures: None ordered today   Follow-Up: At [facility], you and your health needs are our priority.  As part of our continuing mission to provide you with exceptional heart care, we have created designated Provider Care Teams.  These Care Teams include your primary Cardiologist (physician) and Advanced Practice Providers (APPs -  Physician Assistants and Nurse Practitioners) who all work together to provide you with the care you need, when you need it.  We recommend signing up for the patient portal called "MyChart".  Sign up information is provided on this After Visit Summary.  MyChart is used to connect with patients for Virtual Visits (Telemedicine).  Patients are able to view lab/test results, encounter notes, upcoming appointments, etc.  Non-urgent messages can be sent to your provider as well.   To learn more about what you can do with MyChart, go to [email or web address].    Your next appointment:   1 year(s)  The format for your next appointment:   In Person  Provider:   Buford Dresser, MD   Other Instructions -how to check blood pressure:  -sit comfortably in a chair, feet uncrossed and flat on floor, for 5-10 minutes  -arm ideally should rest at the level of the heart. However, arm should be relaxed and not tense (for example, do not hold the arm up unsupported)  -avoid exercise, caffeine, and tobacco for at least 30 minutes prior to BP reading  -don't take BP cuff reading over clothes (always place on skin directly)  -I prefer to know how well the medication is working, so I would like you to take your readings 1-2 hours after taking your blood pressure medication if possible  I like arm cuffs over  wrist cuffs. Omron is a good brand.

## 2020-12-03 ENCOUNTER — Encounter: Payer: Self-pay | Admitting: Internal Medicine

## 2021-05-03 ENCOUNTER — Ambulatory Visit (AMBULATORY_SURGERY_CENTER): Payer: Self-pay

## 2021-05-03 ENCOUNTER — Other Ambulatory Visit: Payer: Self-pay

## 2021-05-03 VITALS — Ht 65.0 in | Wt 188.0 lb

## 2021-05-03 DIAGNOSIS — Z86010 Personal history of colonic polyps: Secondary | ICD-10-CM

## 2021-05-03 MED ORDER — NA SULFATE-K SULFATE-MG SULF 17.5-3.13-1.6 GM/177ML PO SOLN
1.0000 | Freq: Once | ORAL | 0 refills | Status: AC
Start: 2021-05-03 — End: 2021-05-03

## 2021-05-03 NOTE — Progress Notes (Signed)
ext

## 2021-05-06 ENCOUNTER — Encounter: Payer: Self-pay | Admitting: Internal Medicine

## 2021-05-06 ENCOUNTER — Other Ambulatory Visit: Payer: Self-pay | Admitting: Cardiology

## 2021-05-06 DIAGNOSIS — I700 Atherosclerosis of aorta: Secondary | ICD-10-CM

## 2021-05-06 DIAGNOSIS — I2510 Atherosclerotic heart disease of native coronary artery without angina pectoris: Secondary | ICD-10-CM

## 2021-05-06 DIAGNOSIS — E7800 Pure hypercholesterolemia, unspecified: Secondary | ICD-10-CM

## 2021-05-10 ENCOUNTER — Ambulatory Visit (AMBULATORY_SURGERY_CENTER): Payer: 59 | Admitting: Internal Medicine

## 2021-05-10 ENCOUNTER — Encounter: Payer: Self-pay | Admitting: Internal Medicine

## 2021-05-10 VITALS — BP 107/47 | HR 84 | Temp 98.4°F | Resp 10 | Ht 65.0 in | Wt 188.0 lb

## 2021-05-10 DIAGNOSIS — Z86010 Personal history of colonic polyps: Secondary | ICD-10-CM

## 2021-05-10 DIAGNOSIS — D120 Benign neoplasm of cecum: Secondary | ICD-10-CM | POA: Diagnosis not present

## 2021-05-10 DIAGNOSIS — D123 Benign neoplasm of transverse colon: Secondary | ICD-10-CM

## 2021-05-10 MED ORDER — SODIUM CHLORIDE 0.9 % IV SOLN
500.0000 mL | Freq: Once | INTRAVENOUS | Status: DC
Start: 2021-05-10 — End: 2021-05-10

## 2021-05-10 NOTE — Op Note (Signed)
[facility] Patient Name: Rachel Bell Procedure Date: 05/10/2021 12:58 PM MRN: [id number] Endoscopist: Jerene Bears , MD Age: 56 Referring MD:  Date of Birth: Mar 03, 1966 Gender: Female Account #: [id number] Procedure:                Colonoscopy Indications:              High risk colon cancer surveillance: Personal                            history of non-advanced adenoma, Last colonoscopy:                            July 2017 Medicines:                Monitored Anesthesia Care Procedure:                Pre-Anesthesia Assessment:                           - Prior to the procedure, a History and Physical                            was performed, and patient medications and                            allergies were reviewed. The patient's tolerance of                            previous anesthesia was also reviewed. The risks                            and benefits of the procedure and the sedation                            options and risks were discussed with the patient.                            All questions were answered, and informed consent                            was obtained. Prior Anticoagulants: The patient has                            taken no previous anticoagulant or antiplatelet                            agents. ASA Grade Assessment: II - A patient with                            mild systemic disease. After reviewing the risks                            and benefits, the patient was deemed in  satisfactory condition to undergo the procedure.                           After obtaining informed consent, the colonoscope                            was passed under direct vision. Throughout the                            procedure, the patient's blood pressure, pulse, and                            oxygen saturations were monitored continuously. The                            Olympus PCF-H190DL (#[id number]) Colonoscope was                             introduced through the anus and advanced to the                            cecum, identified by appendiceal orifice and                            ileocecal valve. The colonoscopy was performed                            without difficulty. The patient tolerated the                            procedure well. The quality of the bowel                            preparation was good. The ileocecal valve,                            appendiceal orifice, and rectum were photographed. Scope In: 1:13:34 PM Scope Out: 1:26:09 PM Scope Withdrawal Time: 0 hours 10 minutes 23 seconds  Total Procedure Duration: 0 hours 12 minutes 35 seconds  Findings:                 The digital rectal exam was normal.                           Two sessile polyps were found in the cecum. The                            polyps were 3 to 4 mm in size. These polyps were                            removed with a cold snare. Resection and retrieval                            were complete.  A 4 mm polyp was found in the transverse colon. The                            polyp was sessile. The polyp was removed with a                            cold snare. Resection and retrieval were complete.                           A few small-mouthed diverticula were found in the                            sigmoid colon.                           Internal hemorrhoids were found during                            retroflexion. The hemorrhoids were small. Complications:            No immediate complications. Estimated Blood Loss:     Estimated blood loss was minimal. Impression:               - Two 3 to 4 mm polyps in the cecum, removed with a                            cold snare. Resected and retrieved.                           - One 4 mm polyp in the transverse colon, removed                            with a cold snare. Resected and retrieved.                           - Diverticulosis in the  sigmoid colon.                           - Internal hemorrhoids. Recommendation:           - Patient has a contact number available for                            emergencies. The signs and symptoms of potential                            delayed complications were discussed with the                            patient. Return to normal activities tomorrow.                            Written discharge instructions were provided to the  patient.                           - Resume previous diet.                           - Continue present medications.                           - Await pathology results.                           - Repeat colonoscopy is recommended for                            surveillance. The colonoscopy date will be                            determined after pathology results from today's                            exam become available for review. Jerene Bears, MD 05/10/2021 1:28:31 PM This report has been signed electronically.

## 2021-05-10 NOTE — Progress Notes (Signed)
No problems noted in the recovery room. maw 

## 2021-05-10 NOTE — Patient Instructions (Addendum)
Handouts were given to your care partner on polyps, diverticulosis, and hemorrhoids. You may resume your current medications today. Await biopsy results.  May take 1-3 weeks to receive pathology results. Please call if any questions or concerns.      YOU HAD AN ENDOSCOPIC PROCEDURE TODAY AT [facility]:   Refer to the procedure report that was given to you for any specific questions about what was found during the examination.  If the procedure report does not answer your questions, please call your gastroenterologist to clarify.  If you requested that your care partner not be given the details of your procedure findings, then the procedure report has been included in a sealed envelope for you to review at your convenience later.  YOU SHOULD EXPECT: Some feelings of bloating in the abdomen. Passage of more gas than usual.  Walking can help get rid of the air that was put into your GI tract during the procedure and reduce the bloating. If you had a lower endoscopy (such as a colonoscopy or flexible sigmoidoscopy) you may notice spotting of blood in your stool or on the toilet paper. If you underwent a bowel prep for your procedure, you may not have a normal bowel movement for a few days.  Please Note:  You might notice some irritation and congestion in your nose or some drainage.  This is from the oxygen used during your procedure.  There is no need for concern and it should clear up in a day or so.  SYMPTOMS TO REPORT IMMEDIATELY:  Following lower endoscopy (colonoscopy or flexible sigmoidoscopy):  Excessive amounts of blood in the stool  Significant tenderness or worsening of abdominal pains  Swelling of the abdomen that is new, acute  Fever of 100F or higher   For urgent or emergent issues, a gastroenterologist can be reached at any hour by calling [phone]. Do not use MyChart messaging for urgent concerns.    DIET:  We do recommend a small meal at first, but then  you may proceed to your regular diet.  Drink plenty of fluids but you should avoid alcoholic beverages for 24 hours.  ACTIVITY:  You should plan to take it easy for the rest of today and you should NOT DRIVE or use heavy machinery until tomorrow (because of the sedation medicines used during the test).    FOLLOW UP: Our staff will call the number listed on your records 48-72 hours following your procedure to check on you and address any questions or concerns that you may have regarding the information given to you following your procedure. If we do not reach you, we will leave a message.  We will attempt to reach you two times.  During this call, we will ask if you have developed any symptoms of COVID 19. If you develop any symptoms (ie: fever, flu-like symptoms, shortness of breath, cough etc.) before then, please call [phone].  If you test positive for Covid 19 in the 2 weeks post procedure, please call and report this information to [place].    If any biopsies were taken you will be contacted by phone or by letter within the next 1-3 weeks.  Please call us at [phone] if you have not heard about the biopsies in 3 weeks.    SIGNATURES/CONFIDENTIALITY: You and/or your care partner have signed paperwork which will be entered into your electronic medical record.  These signatures attest to the fact that that the information above on your After  Visit Summary has been reviewed and is understood.  Full responsibility of the confidentiality of this discharge information lies with you and/or your care-partner.

## 2021-05-10 NOTE — Progress Notes (Signed)
VS- [facility]  Cell phone off per pt  Pt's states no medical or surgical changes since previsit or office visit.

## 2021-05-10 NOTE — Progress Notes (Signed)
Called to room to assist during endoscopic procedure.  Patient ID and intended procedure confirmed with present staff. Received instructions for my participation in the procedure from the performing physician.  

## 2021-05-10 NOTE — Progress Notes (Signed)
Sedate, gd SR, tolerated procedure well, VSS, report to RN 

## 2021-05-10 NOTE — Progress Notes (Signed)
GASTROENTEROLOGY PROCEDURE H&P NOTE   Primary Care Physician: Lujean Amel, MD    Reason for Procedure:  History of colonic adenoma  Plan:    Colonoscopy  Patient is appropriate for endoscopic procedure(s) in the ambulatory ([facility]) setting.  The nature of the procedure, as well as the risks, benefits, and alternatives were carefully and thoroughly reviewed with the patient. Ample time for discussion and questions allowed. The patient understood, was satisfied, and agreed to proceed.     HPI: Rachel Bell is a 56 y.o. female who presents for surveillance colonoscopy.  Medical history as below.  Tolerated the prep.  No recent chest pain or shortness of breath.  No abdominal pain today.  Past Medical History:  Diagnosis Date   Anxiety    Arthritis    Cancer ([place])    skin cancer   Depression    Family history of breast cancer    Hemorrhoids    Hyperlipidemia    Hypertension     Past Surgical History:  Procedure Laterality Date   CESAREAN SECTION     COLONOSCOPY     > 12 yrs in [place]- pt cannot find records or who    MYOMECTOMY     PARTIAL HYSTERECTOMY      Prior to Admission medications   Medication Sig Start Date End Date Taking? Authorizing Provider  buPROPion (WELLBUTRIN SR) 150 MG 12 hr tablet Take 150 mg by mouth daily.    Yes [provider]  hydrochlorothiazide (HYDRODIURIL) 25 MG tablet Take 25 mg by mouth daily.   Yes [provider]  aspirin EC 81 MG tablet Take 1 tablet (81 mg total) by mouth daily. Swallow whole. Patient not taking: Reported on 05/03/2021 11/29/20   Buford Dresser, MD  Multiple Vitamin (MULITIVITAMIN WITH MINERALS) TABS Take 1 tablet by mouth daily. Patient not taking: Reported on 05/03/2021    [provider]  PRESCRIPTION MEDICATION Apply 1 patch topically every 3 (three) days. Wed and sat. vivelle dot micro.    [provider]  rosuvastatin (CRESTOR) 10 MG tablet TAKE 1 TABLET BY MOUTH   DAILY 05/06/21   Buford Dresser, MD    Current Outpatient Medications  Medication Sig Dispense Refill   buPROPion (WELLBUTRIN SR) 150 MG 12 hr tablet Take 150 mg by mouth daily.      hydrochlorothiazide (HYDRODIURIL) 25 MG tablet Take 25 mg by mouth daily.     aspirin EC 81 MG tablet Take 1 tablet (81 mg total) by mouth daily. Swallow whole. (Patient not taking: Reported on 05/03/2021) 90 tablet 3   Multiple Vitamin (MULITIVITAMIN WITH MINERALS) TABS Take 1 tablet by mouth daily. (Patient not taking: Reported on 05/03/2021)     PRESCRIPTION MEDICATION Apply 1 patch topically every 3 (three) days. Wed and sat. vivelle dot micro.     rosuvastatin (CRESTOR) 10 MG tablet TAKE 1 TABLET BY MOUTH  DAILY 90 tablet 3   Current Facility-Administered Medications  Medication Dose Route Frequency Provider Last Rate Last Admin   0.9 %  sodium chloride infusion  500 mL Intravenous Once Wrigley Plasencia, Lajuan Lines, MD        Allergies as of 05/10/2021   (No Known Allergies)    Family History  Adopted: Yes  Problem Relation Age of Onset   Cancer Mother 33       died of a rare type of cancer- maybe sarcoma?   Breast cancer Half-Sister 35       died at 47 of breast  cancer   Colon cancer Neg Hx    Colon polyps Neg Hx    Rectal cancer Neg Hx    Stomach cancer Neg Hx    Esophageal cancer Neg Hx     Social History   Socioeconomic History   Marital status: Married    Spouse name: Not on file   Number of children: Not on file   Years of education: Not on file   Highest education level: Not on file  Occupational History   Not on file  Tobacco Use   Smoking status: Every Day    Packs/day: 1.00    Types: Cigarettes   Smokeless tobacco: Never  Vaping Use   Vaping Use: Never used  Substance and Sexual Activity   Alcohol use: Yes    Alcohol/week: 0.0 standard drinks    Comment: socially   Drug use: No   Sexual activity: Not on file  Other Topics Concern   Not on file  Social History Narrative   Not  on file   Social Determinants of Health   Financial Resource Strain: Not on file  Food Insecurity: Not on file  Transportation Needs: Not on file  Physical Activity: Not on file  Stress: Not on file  Social Connections: Not on file  Intimate Partner Violence: Not on file    Physical Exam: Vital signs in last 24 hours: @BP  125/76    Pulse 99    Temp 98.4 F (36.9 C) (Skin)    Ht 5\' 5"  (1.651 m)    Wt 188 lb (85.3 kg)    SpO2 97%    BMI 31.28 kg/m  GEN: NAD EYE: Sclerae anicteric ENT: MMM CV: Non-tachycardic Pulm: CTA b/l GI: Soft, NT/ND NEURO:  Alert & Oriented x 3   Zenovia Jarred, MD [facility]  05/10/2021 1:01 PM

## 2021-05-14 ENCOUNTER — Telehealth: Payer: Self-pay | Admitting: *Deleted

## 2021-05-14 ENCOUNTER — Telehealth: Payer: Self-pay

## 2021-05-14 NOTE — Telephone Encounter (Signed)
Second attempt follow up call to pt, LM on VM ?

## 2021-05-14 NOTE — Telephone Encounter (Signed)
No answer on  follow up call. Left message.   

## 2021-05-16 ENCOUNTER — Encounter: Payer: Self-pay | Admitting: Internal Medicine

## 2021-11-18 ENCOUNTER — Other Ambulatory Visit: Payer: Self-pay

## 2021-11-18 ENCOUNTER — Encounter (HOSPITAL_COMMUNITY): Payer: Self-pay | Admitting: Emergency Medicine

## 2021-11-18 ENCOUNTER — Encounter (HOSPITAL_COMMUNITY): Payer: Self-pay

## 2021-11-18 ENCOUNTER — Emergency Department (HOSPITAL_COMMUNITY)
Admission: EM | Admit: 2021-11-18 | Discharge: 2021-11-18 | Discharge status: Against Medical Advice | Payer: 59 | Attending: Emergency Medicine | Admitting: Emergency Medicine

## 2021-11-18 ENCOUNTER — Ambulatory Visit (HOSPITAL_COMMUNITY)
Admission: EM | Admit: 2021-11-18 | Discharge: 2021-11-18 | Disposition: A | Discharge status: Home / Self Care | Payer: 59 | Attending: Family Medicine | Admitting: Family Medicine

## 2021-11-18 ENCOUNTER — Emergency Department (HOSPITAL_COMMUNITY): Payer: 59

## 2021-11-18 DIAGNOSIS — R0602 Shortness of breath: Secondary | ICD-10-CM | POA: Diagnosis not present

## 2021-11-18 DIAGNOSIS — R079 Chest pain, unspecified: Secondary | ICD-10-CM | POA: Insufficient documentation

## 2021-11-18 DIAGNOSIS — Z5321 Procedure and treatment not carried out due to patient leaving prior to being seen by health care provider: Secondary | ICD-10-CM | POA: Insufficient documentation

## 2021-11-18 DIAGNOSIS — R072 Precordial pain: Secondary | ICD-10-CM

## 2021-11-18 LAB — CBC
HCT: 43.4 % (ref 36.0–46.0)
Hemoglobin: 14.8 g/dL (ref 12.0–15.0)
MCH: 30.1 pg (ref 26.0–34.0)
MCHC: 34.1 g/dL (ref 30.0–36.0)
MCV: 88.2 fL (ref 80.0–100.0)
Platelets: 301 10*3/uL (ref 150–400)
RBC: 4.92 MIL/uL (ref 3.87–5.11)
RDW: 12.3 % (ref 11.5–15.5)
WBC: 8.8 10*3/uL (ref 4.0–10.5)
nRBC: 0 % (ref 0.0–0.2)

## 2021-11-18 LAB — TROPONIN I (HIGH SENSITIVITY): Troponin I (High Sensitivity): 4 ng/L (ref <18)

## 2021-11-18 LAB — BASIC METABOLIC PANEL
Anion gap: 8 (ref 5–15)
BUN: 10 mg/dL (ref 6–20)
CO2: 26 mmol/L (ref 22–32)
Calcium: 9 mg/dL (ref 8.9–10.3)
Chloride: 104 mmol/L (ref 98–111)
Creatinine, Ser: 0.63 mg/dL (ref 0.44–1.00)
GFR, Estimated: >60 mL/min (ref >60)
Glucose, Bld: 97 mg/dL (ref 70–99)
Potassium: 3.3 mmol/L — ABNORMAL LOW (ref 3.5–5.1)
Sodium: 138 mmol/L (ref 135–145)

## 2021-11-18 NOTE — ED Notes (Signed)
Patient is being discharged from the Urgent Care and sent to the Emergency Department via personal vehicle . Per Dr.Banister, patient is in need of higher level of care due to Chest Pain. Patient is aware and verbalizes understanding of plan of care.  Vitals:   11/18/21 1822  BP: (!) 147/89  Pulse: 95  Temp: 99.9 F (37.7 C)  SpO2: 94%

## 2021-11-18 NOTE — ED Provider Notes (Signed)
[facility]    CSN: [id number] Arrival date & time: 11/18/21  1721      History   Chief Complaint Chief Complaint  Patient presents with   Chest Pain    HPI Rachel Bell is a 56 y.o. female.    Chest Pain  Here for right-sided chest pain.  It began this afternoon and was a dull ache that did radiate at times to her right jaw and neck.  It did subside and then has come back again.  She did not notice any diaphoresis or nausea.  3 days ago when she was helping her son move into his dorm room, she had an undue amount of sweating/diaphoresis.  That resolved within an hour or so.  He does smoke tobacco  Past Medical History:  Diagnosis Date   Anxiety    Arthritis    Cancer ([place])    skin cancer   Depression    Family history of breast cancer    Hemorrhoids    Hyperlipidemia    Hypertension     Patient Active Problem List   Diagnosis Date Noted   Coronary artery calcification seen on CT scan 10/12/2019   Aortic atherosclerosis ([place]) 10/12/2019   Genetic testing 04/15/2018   Family history of cancer 03/16/2018   Family history of breast cancer     Past Surgical History:  Procedure Laterality Date   CESAREAN SECTION     COLONOSCOPY     > 12 yrs in [place]- pt cannot find records or who    MYOMECTOMY     PARTIAL HYSTERECTOMY      OB History   No obstetric history on file.      Home Medications    Prior to Admission medications   Medication Sig Start Date End Date Taking? Authorizing Provider  aspirin EC 81 MG tablet Take 1 tablet (81 mg total) by mouth daily. Swallow whole. Patient not taking: Reported on 05/03/2021 11/29/20   Buford Dresser, MD  buPROPion [facility] SR) 150 MG 12 hr tablet Take 150 mg by mouth daily.     [provider]  hydrochlorothiazide (HYDRODIURIL) 25 MG tablet Take 25 mg by mouth daily.    [provider]  Multiple Vitamin (MULITIVITAMIN WITH MINERALS) TABS Take 1 tablet by mouth  daily. Patient not taking: Reported on 05/03/2021    [provider]  PRESCRIPTION MEDICATION Apply 1 patch topically every 3 (three) days. Wed and sat. vivelle dot micro.    [provider]  rosuvastatin (CRESTOR) 10 MG tablet TAKE 1 TABLET BY MOUTH  DAILY 05/06/21   Buford Dresser, MD    Family History Family History  Adopted: Yes  Problem Relation Age of Onset   Cancer Mother 64       died of a rare type of cancer- maybe sarcoma?   Breast cancer Half-Sister 32       died at 35 of breast cancer   Colon cancer Neg Hx    Colon polyps Neg Hx    Rectal cancer Neg Hx    Stomach cancer Neg Hx    Esophageal cancer Neg Hx     Social History Social History   Tobacco Use   Smoking status: Every Day    Packs/day: 1.00    Types: Cigarettes   Smokeless tobacco: Never  Vaping Use   Vaping Use: Never used  Substance Use Topics   Alcohol use: Yes    Alcohol/week: 0.0 standard drinks of alcohol  Comment: socially   Drug use: No     Allergies   Patient has no known allergies.   Review of Systems Review of Systems  Cardiovascular:  Positive for chest pain.     Physical Exam Triage Vital Signs ED Triage Vitals [11/18/21 1822]  Enc Vitals Group     BP (!) 147/89     Pulse Rate 95     Resp      Temp 99.9 F (37.7 C)     Temp Source Oral     SpO2 94 %     Weight      Height      Head Circumference      Peak Flow      Pain Score 0     Pain Loc      Pain Edu?      Excl. in [place]?    No data found.  Updated Vital Signs BP (!) 147/89 (BP Location: Right Arm)   Pulse 95   Temp 99.9 F (37.7 C) (Oral)   SpO2 94%   Visual Acuity Right Eye Distance:   Left Eye Distance:   Bilateral Distance:    Right Eye Near:   Left Eye Near:    Bilateral Near:     Physical Exam Vitals reviewed.  Constitutional:      General: She is not in acute distress.    Appearance: She is not ill-appearing, toxic-appearing or diaphoretic.  HENT:      Mouth/Throat:     Mouth: Mucous membranes are moist.  Cardiovascular:     Rate and Rhythm: Normal rate and regular rhythm.  Pulmonary:     Effort: Pulmonary effort is normal.     Breath sounds: Normal breath sounds.  Chest:     Chest wall: No tenderness.  Neurological:     Mental Status: She is alert.      UC Treatments / Results  Labs (all labs ordered are listed, but only abnormal results are displayed) Labs Reviewed - No data to display  EKG   Radiology No results found.  Procedures Procedures (including critical care time)  Medications Ordered in UC Medications - No data to display  Initial Impression / Assessment and Plan / UC Course  I have reviewed the triage vital signs and the nursing notes.  Pertinent labs & imaging results that were available during my care of the patient were reviewed by me and considered in my medical decision making (see chart for details).     I have asked her to proceed to the emergency room for higher level of care and evaluation then we can do here in the urgent care.  We thought she had to wait behind 2 other orders for EKGs, so we did not do an EKG here Final Clinical Impressions(s) / UC Diagnoses   Final diagnoses:  Precordial pain     Discharge Instructions      Please proceed to the ER    ED Prescriptions   None    PDMP not reviewed this encounter.   Barrett Henle, MD 11/18/21 [phone]

## 2021-11-18 NOTE — Discharge Instructions (Addendum)
Please proceed to the ER 

## 2021-11-18 NOTE — ED Provider Triage Note (Signed)
Emergency Medicine Provider Triage Evaluation Note  Rachel Bell , a 56 y.o. female  was evaluated in triage.  Pt complains of right-sided chest pain radiating to the neck that started earlier today, lasted for about 2 hours.  Patient reports that it was not exertional in nature, but she did feel short of breath with it.  She denies any nausea, vomiting, reports questionable radiation to back.  Patient reports history of high blood pressure, high cholesterol, reports that she smokes almost a pack a day, denies history of diabetes.  No family history of ACS less than 20 years old, no previous history of ACS or stroke.  Patient reports she is pain-free at this time.  Review of Systems  Positive: Chest pain, shob Negative: Nv, lightheadedness  Physical Exam  BP 131/77   Pulse 89   Temp 98 F (36.7 C)   Resp 18   SpO2 95%  Gen:   Awake, no distress   Resp:  Normal effort  MSK:   Moves extremities without difficulty  Other:    Medical Decision Making  Medically screening exam initiated at 7:08 PM.  Appropriate orders placed.  Arlenne Emmry Hinsch was informed that the remainder of the evaluation will be completed by another provider, this initial triage assessment does not replace that evaluation, and the importance of remaining in the ED until their evaluation is complete.  Workup initiated   Dorien Chihuahua 11/18/21 1909

## 2021-11-18 NOTE — ED Triage Notes (Signed)
Patient reports intermittent right chest pain radiating to right upper back onset today with mild SOB , no emesis or diaphoresis .

## 2021-11-18 NOTE — ED Notes (Signed)
Pt left AMA °

## 2021-11-18 NOTE — ED Triage Notes (Signed)
Pt c/o right sided chest pain, dull and aching, lasting "several hours" earlier today. Pain radiated into the right side of the jaw.

## 2021-11-20 NOTE — Progress Notes (Signed)
Cardiology Office Note:    Date:  11/22/2021   ID:  Rachel, Bell [birth year], MRN [id number]  PCP:  Lujean Amel, MD  Cardiologist:  Buford Dresser, MD  Referring MD: Lujean Amel, MD   CC: follow up  History of Present Illness:    Rachel Bell is a 56 y.o. female with a hx of hypertension, hyperlipidemia, tobacco use, fatty liver who is seen for follow up. I initially met her 10/12/19 as a new consult at the request of Koirala, Dibas, MD for the evaluation and management of aortic atherosclerosis and coronary calcification.  Cardiovascular risk factors: Comorbid conditions: Endorses hypertension, hyperlipidemia, fatty liver. Denies diabetes, chronic kidney disease. Noted to have coronary calcification on CT scan. Metabolic syndrome/Obesity: currently at highest adult weight.  At her last appointment she was still smoking just under 1 ppd. She planned to obtain nicotine patches. She was encouraged to keep working on diet and exercise as well.  On 11/18/2021 she presented to urgent care for right-sided chest pain. She was advised to proceed to the ER for higher level of care. At the ED her EKG showed NSR at 89 bpm. BMP was notable for potassium of 3.3. Troponin was 4. Chest X-ray was unremarkable. It was noted that she left prior to completion of evaluation.  Today: She describes her chest pain as a dull pain, always localized to her upper right chest/shoulder. Initially she tried ignoring the pain while working. Her pain then progressed to radiating to her right jaw, so she presented to urgent care as described above. While waiting for hours in the emergency room her chest pain resolved and returned a few times. The following day, her chest pain continued in intermittent episodes.   At today's visit, her chest pain seems to have completely resolved. However, she confirms current tenderness of the same area as her prior chest pain. She is not sure if she has ever  had similar chest pains in the past as she may have ignored those pains.  She notes that a few days prior to the onset of her chest pain she had helped her son move into college. This was in [place] and it was a very hot day. Her husband noticed that her face was erythematous and she was more diaphoretic than usual. Lately she has also been very stressed with multiple life stressors. She complains of frequent fatigue as well.  She is still smoking about the same amount. She continues to work on quitting.  She denies any palpitations, shortness of breath, or peripheral edema. No lightheadedness, headaches, syncope, orthopnea, or PND.   Past Medical History:  Diagnosis Date   Anxiety    Arthritis    Cancer ([place])    skin cancer   Depression    Family history of breast cancer    Hemorrhoids    Hyperlipidemia    Hypertension     Past Surgical History:  Procedure Laterality Date   CESAREAN SECTION     COLONOSCOPY     > 12 yrs in [place]- pt cannot find records or who    MYOMECTOMY     PARTIAL HYSTERECTOMY      Current Medications: Current Outpatient Medications on File Prior to Visit  Medication Sig   aspirin EC 81 MG tablet Take 1 tablet (81 mg total) by mouth daily. Swallow whole. (Patient not taking: Reported on 05/03/2021)   buPROPion (WELLBUTRIN SR) 150 MG 12 hr tablet Take 150 mg by mouth daily.  hydrochlorothiazide (HYDRODIURIL) 25 MG tablet Take 25 mg by mouth daily.   Multiple Vitamin (MULITIVITAMIN WITH MINERALS) TABS Take 1 tablet by mouth daily. (Patient not taking: Reported on 05/03/2021)   PRESCRIPTION MEDICATION Apply 1 patch topically every 3 (three) days. Wed and sat. vivelle dot micro.   rosuvastatin (CRESTOR) 10 MG tablet TAKE 1 TABLET BY MOUTH  DAILY   No current facility-administered medications on file prior to visit.     Allergies:   Patient has no known allergies.   Social History   Tobacco Use   Smoking status: Every Day    Packs/day: 1.00    Types:  Cigarettes   Smokeless tobacco: Never  Vaping Use   Vaping Use: Never used  Substance Use Topics   Alcohol use: Yes    Alcohol/week: 0.0 standard drinks of alcohol    Comment: socially   Drug use: No    Family History: family history includes Breast cancer (age of onset: 64) in her half-sister; Cancer (age of onset: 42) in her mother. There is no history of Colon cancer, Colon polyps, Rectal cancer, Stomach cancer, or Esophageal cancer. She was adopted. she is adopted, but she is aware of some of her family history. Maternal grandmother had heart issues, died in late 40s/early 64s.Mother died of pelvic cancer in young middle age. Paternal side with longevity.  ROS:   Please see the history of present illness.   (+) Chest pain (seems to be resolved at this time) (+) Stress (+) Fatigue Additional pertinent ROS otherwise unremarkable.  EKGs/Labs/Other Studies Reviewed:    The following studies were reviewed today:  CT Chest  10/05/2020: FINDINGS: Cardiovascular: Normal heart size. No significant pericardial effusion/thickening. Left anterior descending coronary atherosclerosis Atherosclerotic nonaneurysmal thoracic aorta. Normal caliber pulmonary arteries.   Mediastinum/Nodes: No discrete thyroid nodules. Unremarkable esophagus. No pathologically enlarged axillary, mediastinal or hilar lymph nodes, noting limited sensitivity for the detection of hilar adenopathy on this noncontrast study.   Lungs/Pleura: No pneumothorax. No pleural effusion. Mild centrilobular emphysema with mild diffuse bronchial wall thickening. No acute consolidative airspace disease or lung masses. Tiny stable right solid pulmonary nodules, largest 3.5 mm in volume derived mean diameter along the minor fissure (series 3/image 139). No new significant pulmonary nodules.   Upper abdomen: Diffuse hepatic steatosis.   Musculoskeletal: No aggressive appearing focal osseous lesions. Mild thoracic  spondylosis.   IMPRESSION: 1. Lung-RADS 2, benign appearance or behavior. Continue annual screening with low-dose chest CT without contrast in 12 months. 2. One vessel coronary atherosclerosis. 3. Diffuse hepatic steatosis. 4. Aortic Atherosclerosis (ICD10-I70.0) and Emphysema (ICD10-J43.9).  CT chest 08/17/19 FINDINGS: Cardiovascular: Aortic atherosclerosis. Normal heart size, without pericardial effusion. Lad coronary artery calcification, including on 69/2 and 75/2.   Mediastinum/Nodes: No mediastinal or definite hilar adenopathy, given limitations of unenhanced CT.   Lungs/Pleura: No pleural fluid. Subpleural right upper lobe 4 mm nodule on 59/8, similar.   Nodule along the right minor fissure is similar at 4 mm on 67/8.   Upper Abdomen: Moderate to marked hepatic steatosis. Normal imaged portions of the spleen, stomach, pancreas, adrenal glands, kidneys.   Musculoskeletal: Mild mid thoracic spondylosis.   IMPRESSION: 1. Perifissural nodules in the right upper and middle lobes are most consistent with subpleural lymph nodes. These are unchanged. 2. Age advanced coronary artery atherosclerosis. Recommend assessment of coronary risk factors and consideration of medical therapy. 3. Hepatic steatosis.  EKG:  EKG is personally reviewed.   11/22/2021:  EKG was not ordered. 11/29/20: NSR 92bpm,  low anterior forces  Recent Labs: 11/18/2021: BUN 10; Creatinine, Ser 0.63; Hemoglobin 14.8; Platelets 301; Potassium 3.3; Sodium 138   Recent Lipid Panel No results found for: "CHOL", "TRIG", "HDL", "CHOLHDL", "VLDL", "LDLCALC", "LDLDIRECT"  Physical Exam:    VS:  BP 132/70 (BP Location: Right Arm, Patient Position: Sitting)   Pulse 94   Ht _0  (1.651 m)   Wt 195 lb 11.2 oz (88.8 kg)   SpO2 96%   BMI 32.57 kg/m     Wt Readings from Last 3 Encounters:  11/22/21 195 lb 11.2 oz (88.8 kg)  05/10/21 188 lb (85.3 kg)  05/03/21 188 lb (85.3 kg)    GEN: Well nourished, well  developed in no acute distress HEENT: Normal, moist mucous membranes NECK: No JVD CARDIAC: regular rhythm, normal S1 and S2, no rubs or gallops. No murmur. VASCULAR: Radial and DP pulses 2+ bilaterally. No carotid bruits RESPIRATORY:  Clear to auscultation without rales, wheezing or rhonchi  ABDOMEN: Soft, non-tender, non-distended MUSCULOSKELETAL:  Ambulates independently; Tenderness of right chest with some firmness SKIN: Warm and dry, no edema NEUROLOGIC:  Alert and oriented x 3. No focal neuro deficits noted. PSYCHIATRIC:  Normal affect    ASSESSMENT:    1. Tobacco abuse counseling   2. Precordial pain   3. Aortic atherosclerosis ([place])   4. Coronary artery calcification seen on CT scan   5. Essential hypertension   6. Pure hypercholesterolemia      PLAN:    chest pain -there are atypical components (location, tenderness on palpation) but she has known coronary artery calcification, aortic atherosclerosis, hypertension, hypercholesterolemia, and tobacco use as risk factors. -discussed treadmill stress, nuclear stress/lexiscan, and CT coronary angiography. Discussed pros and cons of each, including but not limited to false positive/false negative risk, radiation risk, and risk of IV contrast dye. Based on shared decision making, decision was made to pursue CT coronary angiography. -will give one time dose of metoprolol 2 hours prior to scheduled test -counseled on need to get BMET prior to test -counseled on use of sublingual nitroglycerin and its importance to a good test  -reviewed red flag warning signs that need immediate medical attention  Aortic atherosclerosis Coronary artery calcification -tolerating rosuvastatin -lipids from 07/2021 per [facility]: Tchol 152, HDL 45, LDL 83, TG 130 -discussed importance of tobacco cessation -tolerating aspirin  Hypertension: near goal today -continue HCTZ -discussed home BP monitoring  Hypercholesterolemia: -as above, continue  rosuvastatin -hepatic steatosis noted on CT  Abnormal glucose -A1c 6.3 -BMI 32  Tobacco cessation: The patient was counseled on tobacco cessation today for 3 minutes.  Counseling included reviewing the risks of smoking tobacco products, how it impacts the patient's current medical diagnoses and different strategies for quitting.  Pharmacotherapy to aid in tobacco cessation was not prescribed today.   Cardiac risk counseling and prevention recommendations: -recommend heart healthy/Mediterranean diet, with whole grains, fruits, vegetable, fish, lean meats, nuts, and olive oil. Limit salt. -recommend moderate walking, 3-5 times/week for 30-50 minutes each session. Aim for at least 150 minutes.week. Goal should be pace of 3 miles/hours, or walking 1.5 miles in 30 minutes -recommend avoidance of tobacco products. Avoid excess alcohol.  Plan for follow up: 1 month or sooner as needed  Buford Dresser, MD, PhD [place]  [facility]    Medication Adjustments/Labs and Tests Ordered: Current medicines are reviewed at length with the patient today.  Concerns regarding medicines are outlined above.   Orders Placed This Encounter  Procedures   CT  CORONARY MORPH W/CTA COR W/SCORE W/CA W/CM &/OR WO/CM   Meds ordered this encounter  Medications   metoprolol tartrate (LOPRESSOR) 100 MG tablet    Sig: Take 1 tablet (100 mg total) by mouth once for 1 dose. 2 hours prior to Cardiac CTA    Dispense:  1 tablet    Refill:  0   Patient Instructions  Medication Instructions:  Your Physician recommend you continue on your current medication as directed.    *If you need a refill on your cardiac medications before your next appointment, please call your pharmacy*   Lab Work: None ordered today   Testing/Procedures: Cardiac CT Angiography (CTA), is a special type of CT scan that uses a computer to produce multi-dimensional views of major blood vessels throughout the body. In CT  angiography, a contrast material is injected through an IV to help visualize the blood vessels  [facility]   Follow-Up: At [facility], you and your health needs are our priority.  As part of our continuing mission to provide you with exceptional heart care, we have created designated Provider Care Teams.  These Care Teams include your primary Cardiologist (physician) and Advanced Practice Providers (APPs -  Physician Assistants and Nurse Practitioners) who all work together to provide you with the care you need, when you need it.  We recommend signing up for the patient portal called "MyChart".  Sign up information is provided on this After Visit Summary.  MyChart is used to connect with patients for Virtual Visits (Telemedicine).  Patients are able to view lab/test results, encounter notes, upcoming appointments, etc.  Non-urgent messages can be sent to your provider as well.   To learn more about what you can do with MyChart, go to [email or web address].    Your next appointment:   1 month(s)  The format for your next appointment:   In Person  Provider:   Buford Dresser, MD{    Your cardiac CT will be scheduled at one of the below locations:   [facility] [address] [place], [place] [address] [phone]   If scheduled at [facility], please arrive at the [facility] and Children's Entrance (Entrance C2) of [facility] 30 minutes prior to test start time. You can use the FREE valet parking offered at entrance C (encouraged to control the heart rate for the test)  Proceed to the [facility] Radiology Department (first floor) to check-in and test prep.  All radiology patients and guests should use entrance C2 at [facility], accessed from [facility], even though the hospital's physical address listed is [address]    If scheduled at [facility], please arrive 15 mins  early for check-in and test prep.  Please follow these instructions carefully (unless otherwise directed):  Hold all erectile dysfunction medications at least 3 days (72 hrs) prior to test.  On the Night Before the Test: Be sure to Drink plenty of water. Do not consume any caffeinated/decaffeinated beverages or chocolate 12 hours prior to your test. Do not take any antihistamines 12 hours prior to your test.  On the Day of the Test: Drink plenty of water until 1 hour prior to the test. Do not eat any food 4 hours prior to the test. You may take your regular medications prior to the test.  Take metoprolol (Lopressor) 100 mg two hours prior to test. HOLD Hydrochlorothiazide morning of the test. FEMALES- please wear underwire-free bra if  available, avoid dresses & tight clothing       After the Test: Drink plenty of water. After receiving IV contrast, you may experience a mild flushed feeling. This is normal. On occasion, you may experience a mild rash up to 24 hours after the test. This is not dangerous. If this occurs, you can take Benadryl 25 mg and increase your fluid intake. If you experience trouble breathing, this can be serious. If it is severe call 911 IMMEDIATELY. If it is mild, please call our office. If you take any of these medications: Glipizide/Metformin, Avandament, Glucavance, please do not take 48 hours after completing test unless otherwise instructed.  We will call to schedule your test 2-4 weeks out understanding that some insurance companies will need an authorization prior to the service being performed.   For non-scheduling related questions, please contact the cardiac imaging nurse navigator should you have any questions/concerns: Marchia Bond, Cardiac Imaging Nurse Navigator Gordy Clement, Cardiac Imaging Nurse Navigator [place] [facility] Direct Office Dial: [phone]   For scheduling needs, including cancellations and rescheduling,  please call [place], [phone].         I,Mathew Stumpf,acting as a Education administrator for [employer], MD.,have documented all relevant documentation on the behalf of Buford Dresser, MD,as directed by  Buford Dresser, MD while in the presence of Buford Dresser, MD.  I, Buford Dresser, MD, have reviewed all documentation for this visit. The documentation on 12/22/21 for the exam, diagnosis, procedures, and orders are all accurate and complete.   Signed, Buford Dresser, MD PhD 11/22/2021  [place]

## 2021-11-22 ENCOUNTER — Ambulatory Visit (INDEPENDENT_AMBULATORY_CARE_PROVIDER_SITE_OTHER): Payer: 59 | Admitting: Cardiology

## 2021-11-22 VITALS — BP 132/70 | HR 94 | Ht 65.0 in | Wt 195.7 lb

## 2021-11-22 DIAGNOSIS — I2510 Atherosclerotic heart disease of native coronary artery without angina pectoris: Secondary | ICD-10-CM

## 2021-11-22 DIAGNOSIS — R072 Precordial pain: Secondary | ICD-10-CM | POA: Diagnosis not present

## 2021-11-22 DIAGNOSIS — Z716 Tobacco abuse counseling: Secondary | ICD-10-CM

## 2021-11-22 DIAGNOSIS — I700 Atherosclerosis of aorta: Secondary | ICD-10-CM | POA: Diagnosis not present

## 2021-11-22 DIAGNOSIS — I10 Essential (primary) hypertension: Secondary | ICD-10-CM

## 2021-11-22 DIAGNOSIS — E7800 Pure hypercholesterolemia, unspecified: Secondary | ICD-10-CM

## 2021-11-22 MED ORDER — METOPROLOL TARTRATE 100 MG PO TABS: 100.0000 mg | ORAL_TABLET | Freq: Once | ORAL | 0 refills | Status: DC

## 2021-11-22 NOTE — Patient Instructions (Addendum)
Medication Instructions:  Your Physician recommend you continue on your current medication as directed.    *If you need a refill on your cardiac medications before your next appointment, please call your pharmacy*   Lab Work: None ordered today   Testing/Procedures: Cardiac CT Angiography (CTA), is a special type of CT scan that uses a computer to produce multi-dimensional views of major blood vessels throughout the body. In CT angiography, a contrast material is injected through an IV to help visualize the blood vessels  [facility]   Follow-Up: At [facility], you and your health needs are our priority.  As part of our continuing mission to provide you with exceptional heart care, we have created designated Provider Care Teams.  These Care Teams include your primary Cardiologist (physician) and Advanced Practice Providers (APPs -  Physician Assistants and Nurse Practitioners) who all work together to provide you with the care you need, when you need it.  We recommend signing up for the patient portal called "MyChart".  Sign up information is provided on this After Visit Summary.  MyChart is used to connect with patients for Virtual Visits (Telemedicine).  Patients are able to view lab/test results, encounter notes, upcoming appointments, etc.  Non-urgent messages can be sent to your provider as well.   To learn more about what you can do with MyChart, go to [email or web address].    Your next appointment:   1 month(s)  The format for your next appointment:   In Person  Provider:   Buford Dresser, MD{    Your cardiac CT will be scheduled at one of the below locations:   [facility] [address] [place], [place] [address] [phone]   If scheduled at [facility], please arrive at the [facility] and Children's Entrance (Entrance C2) of [facility] 30 minutes prior to test start time. You can use the FREE valet parking offered  at entrance C (encouraged to control the heart rate for the test)  Proceed to the [facility] Radiology Department (first floor) to check-in and test prep.  All radiology patients and guests should use entrance C2 at [facility], accessed from [facility], even though the hospital's physical address listed is [address].    If scheduled at [facility], please arrive 15 mins early for check-in and test prep.  Please follow these instructions carefully (unless otherwise directed):  Hold all erectile dysfunction medications at least 3 days (72 hrs) prior to test.  On the Night Before the Test: Be sure to Drink plenty of water. Do not consume any caffeinated/decaffeinated beverages or chocolate 12 hours prior to your test. Do not take any antihistamines 12 hours prior to your test.  On the Day of the Test: Drink plenty of water until 1 hour prior to the test. Do not eat any food 4 hours prior to the test. You may take your regular medications prior to the test.  Take metoprolol (Lopressor) 100 mg two hours prior to test. HOLD Hydrochlorothiazide morning of the test. FEMALES- please wear underwire-free bra if available, avoid dresses & tight clothing       After the Test: Drink plenty of water. After receiving IV contrast, you may experience a mild flushed feeling. This is normal. On occasion, you may experience a mild rash up to 24 hours after the test. This is not dangerous. If this occurs, you can take Benadryl 25 mg and increase your fluid intake.  If you experience trouble breathing, this can be serious. If it is severe call 911 IMMEDIATELY. If it is mild, please call our office. If you take any of these medications: Glipizide/Metformin, Avandament, Glucavance, please do not take 48 hours after completing test unless otherwise instructed.  We will call to schedule your test 2-4 weeks out understanding that some insurance  companies will need an authorization prior to the service being performed.   For non-scheduling related questions, please contact the cardiac imaging nurse navigator should you have any questions/concerns: Marchia Bond, Cardiac Imaging Nurse Navigator Gordy Clement, Cardiac Imaging Nurse Navigator [place] [facility] Direct Office Dial: [phone]   For scheduling needs, including cancellations and rescheduling, please call [place], [phone].

## 2021-12-11 ENCOUNTER — Telehealth (HOSPITAL_COMMUNITY): Payer: Self-pay | Admitting: Emergency Medicine

## 2021-12-11 NOTE — Telephone Encounter (Signed)
Attempted to call patient regarding upcoming cardiac CT appointment. °Left message on voicemail with name and callback number °Tanis Burnley RN Navigator Cardiac Imaging °[place] [facility] °[phone] Office °[phone] Cell ° °

## 2021-12-12 ENCOUNTER — Telehealth (HOSPITAL_COMMUNITY): Payer: Self-pay | Admitting: *Deleted

## 2021-12-12 NOTE — Telephone Encounter (Signed)
Patient returning call regarding upcoming cardiac imaging study; pt verbalizes understanding of appt date/time, parking situation and where to check in,  medications ordered, and verified current allergies; name and call back number provided for further questions should they arise  Natalie Leclaire RN Navigator Cardiac Imaging [place] [facility] [phone] office [phone] cell  Patient to take 100mg metoprolol tartrate two hours prior to her cardiac CT scan.  She is aware to arrive at 9am. 

## 2021-12-13 ENCOUNTER — Ambulatory Visit (HOSPITAL_COMMUNITY)
Admission: RE | Admit: 2021-12-13 | Discharge: 2021-12-13 | Disposition: A | Discharge status: Home / Self Care | Payer: 59 | Source: Ambulatory Visit | Attending: Cardiology | Admitting: Cardiology

## 2021-12-13 DIAGNOSIS — R931 Abnormal findings on diagnostic imaging of heart and coronary circulation: Secondary | ICD-10-CM | POA: Insufficient documentation

## 2021-12-13 DIAGNOSIS — I2510 Atherosclerotic heart disease of native coronary artery without angina pectoris: Secondary | ICD-10-CM

## 2021-12-13 DIAGNOSIS — R072 Precordial pain: Secondary | ICD-10-CM

## 2021-12-13 MED ORDER — NITROGLYCERIN 0.4 MG SL SUBL
SUBLINGUAL_TABLET | SUBLINGUAL | Status: AC
  Filled 2021-12-13: qty 2

## 2021-12-13 MED ORDER — METOPROLOL TARTRATE 5 MG/5ML IV SOLN
10.0000 mg | Freq: Once | INTRAVENOUS | Status: AC
  Administered 2021-12-13: 10 mg via INTRAVENOUS

## 2021-12-13 MED ORDER — METOPROLOL TARTRATE 5 MG/5ML IV SOLN
INTRAVENOUS | Status: AC
  Filled 2021-12-13: qty 10

## 2021-12-13 MED ORDER — NITROGLYCERIN 0.4 MG SL SUBL
0.8000 mg | SUBLINGUAL_TABLET | Freq: Once | SUBLINGUAL | Status: AC
  Administered 2021-12-13: 0.8 mg via SUBLINGUAL

## 2021-12-13 MED ORDER — METOPROLOL TARTRATE 5 MG/5ML IV SOLN
INTRAVENOUS | Status: AC
  Administered 2021-12-13: 10 mg via INTRAVENOUS
  Filled 2021-12-13: qty 10

## 2021-12-13 MED ORDER — METOPROLOL TARTRATE 5 MG/5ML IV SOLN: 10.0000 mg | Freq: Once | INTRAVENOUS | Status: AC

## 2021-12-13 MED ORDER — IOHEXOL 350 MG/ML SOLN
100.0000 mL | Freq: Once | INTRAVENOUS | Status: AC | PRN
Start: 2021-12-13 — End: 2021-12-13
  Administered 2021-12-13: 100 mL via INTRAVENOUS

## 2021-12-14 ENCOUNTER — Ambulatory Visit (HOSPITAL_BASED_OUTPATIENT_CLINIC_OR_DEPARTMENT_OTHER)
Admission: RE | Admit: 2021-12-14 | Discharge: 2021-12-14 | Disposition: A | Discharge status: Home / Self Care | Payer: 59 | Source: Ambulatory Visit | Attending: Internal Medicine | Admitting: Internal Medicine

## 2021-12-14 ENCOUNTER — Other Ambulatory Visit: Payer: Self-pay | Admitting: Internal Medicine

## 2021-12-14 ENCOUNTER — Ambulatory Visit (HOSPITAL_COMMUNITY)
Admission: RE | Admit: 2021-12-14 | Discharge: 2021-12-14 | Disposition: A | Discharge status: Home / Self Care | Payer: 59 | Source: Ambulatory Visit | Attending: Internal Medicine | Admitting: Internal Medicine

## 2021-12-14 DIAGNOSIS — R931 Abnormal findings on diagnostic imaging of heart and coronary circulation: Secondary | ICD-10-CM

## 2021-12-22 ENCOUNTER — Encounter (HOSPITAL_BASED_OUTPATIENT_CLINIC_OR_DEPARTMENT_OTHER): Payer: Self-pay | Admitting: Cardiology

## 2021-12-31 ENCOUNTER — Encounter (HOSPITAL_BASED_OUTPATIENT_CLINIC_OR_DEPARTMENT_OTHER): Payer: Self-pay | Admitting: Cardiology

## 2021-12-31 ENCOUNTER — Ambulatory Visit (INDEPENDENT_AMBULATORY_CARE_PROVIDER_SITE_OTHER): Payer: 59 | Admitting: Cardiology

## 2021-12-31 VITALS — BP 134/88 | HR 96 | Ht 65.0 in | Wt 196.4 lb

## 2021-12-31 DIAGNOSIS — I2510 Atherosclerotic heart disease of native coronary artery without angina pectoris: Secondary | ICD-10-CM

## 2021-12-31 DIAGNOSIS — E7800 Pure hypercholesterolemia, unspecified: Secondary | ICD-10-CM | POA: Diagnosis not present

## 2021-12-31 DIAGNOSIS — I700 Atherosclerosis of aorta: Secondary | ICD-10-CM | POA: Diagnosis not present

## 2021-12-31 DIAGNOSIS — Z716 Tobacco abuse counseling: Secondary | ICD-10-CM | POA: Diagnosis not present

## 2021-12-31 DIAGNOSIS — R072 Precordial pain: Secondary | ICD-10-CM

## 2021-12-31 DIAGNOSIS — I10 Essential (primary) hypertension: Secondary | ICD-10-CM

## 2021-12-31 MED ORDER — ROSUVASTATIN CALCIUM 20 MG PO TABS: 20.0000 mg | ORAL_TABLET | Freq: Every day | ORAL | 3 refills | Status: DC

## 2021-12-31 NOTE — Patient Instructions (Signed)
Medication Instructions:  INCREASE YOUR ROSUVASTATIN TO 20 MG DAILY   *If you need a refill on your cardiac medications before your next appointment, please call your pharmacy*  Lab Work: FASTING LIPID IN 3 MONTHS   If you have labs (blood work) drawn today and your tests are completely normal, you will receive your results only by: [place] (if you have MyChart) OR A paper copy in the mail If you have any lab test that is abnormal or we need to change your treatment, we will call you to review the results.  Testing/Procedures: NONE  Follow-Up: At [facility], you and your health needs are our priority.  As part of our continuing mission to provide you with exceptional heart care, we have created designated Provider Care Teams.  These Care Teams include your primary Cardiologist (physician) and Advanced Practice Providers (APPs -  Physician Assistants and Nurse Practitioners) who all work together to provide you with the care you need, when you need it.  We recommend signing up for the patient portal called "MyChart".  Sign up information is provided on this After Visit Summary.  MyChart is used to connect with patients for Virtual Visits (Telemedicine).  Patients are able to view lab/test results, encounter notes, upcoming appointments, etc.  Non-urgent messages can be sent to your provider as well.   To learn more about what you can do with MyChart, go to [email or web address].    Your next appointment:   12 month(s)  The format for your next appointment:   In Person  Provider:   Buford Dresser, MD

## 2022-04-20 ENCOUNTER — Encounter (HOSPITAL_BASED_OUTPATIENT_CLINIC_OR_DEPARTMENT_OTHER): Payer: Self-pay | Admitting: Cardiology

## 2022-11-01 ENCOUNTER — Other Ambulatory Visit (HOSPITAL_BASED_OUTPATIENT_CLINIC_OR_DEPARTMENT_OTHER): Payer: Self-pay | Admitting: Cardiology

## 2022-11-01 DIAGNOSIS — I700 Atherosclerosis of aorta: Secondary | ICD-10-CM

## 2022-11-01 DIAGNOSIS — E7800 Pure hypercholesterolemia, unspecified: Secondary | ICD-10-CM

## 2022-11-01 DIAGNOSIS — I2510 Atherosclerotic heart disease of native coronary artery without angina pectoris: Secondary | ICD-10-CM

## 2023-01-20 ENCOUNTER — Other Ambulatory Visit (HOSPITAL_BASED_OUTPATIENT_CLINIC_OR_DEPARTMENT_OTHER): Payer: Self-pay | Admitting: Cardiology

## 2023-01-20 DIAGNOSIS — E7800 Pure hypercholesterolemia, unspecified: Secondary | ICD-10-CM

## 2023-01-20 DIAGNOSIS — I700 Atherosclerosis of aorta: Secondary | ICD-10-CM

## 2023-01-20 DIAGNOSIS — I2510 Atherosclerotic heart disease of native coronary artery without angina pectoris: Secondary | ICD-10-CM

## 2023-02-05 ENCOUNTER — Other Ambulatory Visit (HOSPITAL_BASED_OUTPATIENT_CLINIC_OR_DEPARTMENT_OTHER): Payer: Self-pay | Admitting: Cardiology

## 2023-02-05 DIAGNOSIS — I700 Atherosclerosis of aorta: Secondary | ICD-10-CM

## 2023-02-05 DIAGNOSIS — E7800 Pure hypercholesterolemia, unspecified: Secondary | ICD-10-CM

## 2023-02-05 DIAGNOSIS — I2510 Atherosclerotic heart disease of native coronary artery without angina pectoris: Secondary | ICD-10-CM

## 2023-03-05 ENCOUNTER — Other Ambulatory Visit (HOSPITAL_BASED_OUTPATIENT_CLINIC_OR_DEPARTMENT_OTHER): Payer: Self-pay | Admitting: Cardiology

## 2023-03-05 DIAGNOSIS — I2510 Atherosclerotic heart disease of native coronary artery without angina pectoris: Secondary | ICD-10-CM

## 2023-03-05 DIAGNOSIS — E7800 Pure hypercholesterolemia, unspecified: Secondary | ICD-10-CM

## 2023-03-05 DIAGNOSIS — I700 Atherosclerosis of aorta: Secondary | ICD-10-CM

## 2023-03-11 ENCOUNTER — Other Ambulatory Visit (HOSPITAL_BASED_OUTPATIENT_CLINIC_OR_DEPARTMENT_OTHER): Payer: Self-pay | Admitting: Cardiology

## 2023-03-11 DIAGNOSIS — E7800 Pure hypercholesterolemia, unspecified: Secondary | ICD-10-CM

## 2023-03-11 DIAGNOSIS — I700 Atherosclerosis of aorta: Secondary | ICD-10-CM

## 2023-03-11 DIAGNOSIS — I2510 Atherosclerotic heart disease of native coronary artery without angina pectoris: Secondary | ICD-10-CM

## 2023-03-17 ENCOUNTER — Ambulatory Visit (HOSPITAL_BASED_OUTPATIENT_CLINIC_OR_DEPARTMENT_OTHER): Payer: 59 | Admitting: Cardiology

## 2023-03-17 ENCOUNTER — Encounter (HOSPITAL_BASED_OUTPATIENT_CLINIC_OR_DEPARTMENT_OTHER): Payer: Self-pay | Admitting: Cardiology

## 2023-03-17 VITALS — BP 128/86 | HR 88 | Ht 65.0 in | Wt 176.0 lb

## 2023-03-17 DIAGNOSIS — E7800 Pure hypercholesterolemia, unspecified: Secondary | ICD-10-CM

## 2023-03-17 DIAGNOSIS — Z7189 Other specified counseling: Secondary | ICD-10-CM

## 2023-03-17 DIAGNOSIS — I700 Atherosclerosis of aorta: Secondary | ICD-10-CM

## 2023-03-17 DIAGNOSIS — E782 Mixed hyperlipidemia: Secondary | ICD-10-CM

## 2023-03-17 DIAGNOSIS — E8881 Metabolic syndrome: Secondary | ICD-10-CM

## 2023-03-17 DIAGNOSIS — I2510 Atherosclerotic heart disease of native coronary artery without angina pectoris: Secondary | ICD-10-CM

## 2023-03-17 DIAGNOSIS — Z716 Tobacco abuse counseling: Secondary | ICD-10-CM

## 2023-03-17 MED ORDER — ROSUVASTATIN CALCIUM 20 MG PO TABS: 20.0000 mg | ORAL_TABLET | Freq: Every day | ORAL | 3 refills | Status: DC

## 2023-03-17 NOTE — Patient Instructions (Signed)
Medication Instructions:  Your physician recommends that you continue on your current medications as directed. Please refer to the Current Medication list given to you today.  *If you need a refill on your cardiac medications before your next appointment, please call your pharmacy*  Lab Work: NONE  Testing/Procedures: NONE  Follow-Up: At [facility], you and your health needs are our priority.  As part of our continuing mission to provide you with exceptional heart care, we have created designated Provider Care Teams.  These Care Teams include your primary Cardiologist (physician) and Advanced Practice Providers (APPs -  Physician Assistants and Nurse Practitioners) who all work together to provide you with the care you need, when you need it.  We recommend signing up for the patient portal called "MyChart".  Sign up information is provided on this After Visit Summary.  MyChart is used to connect with patients for Virtual Visits (Telemedicine).  Patients are able to view lab/test results, encounter notes, upcoming appointments, etc.  Non-urgent messages can be sent to your provider as well.   To learn more about what you can do with MyChart, go to [email or web address].    Your next appointment:   July 2025  The format for your next appointment:   In Person  Provider:   Jodelle Red, MD

## 2023-03-17 NOTE — Progress Notes (Signed)
Cardiology Office Note:  .   Date:  03/17/2023  ID:  Irvin Hibbitt, DOB 1966-02-05, MRN [id number] PCP: Darrow Bussing, MD  [facility] Providers Cardiologist:  Jodelle Red, MD {  History of Present Illness: .   Rachel Bell is a 57 y.o. female with a hx of moderate nonobstructive CAD, hypertension, hyperlipidemia, tobacco use, fatty liver who is seen for follow up. I initially met her 10/12/19 as a new consult at the request of Koirala, Dibas, MD for the evaluation and management of aortic atherosclerosis and coronary calcification.   CV history:  CT coronary 11/2021 with moderate CAD in distal RCA, 50-69%. FFR negative. Ca score 48 (88th pecentile)  Today: Still smoking, trying to quit. Stopped aspirin for a while but has since restarted. Wondered if her resting heart rate is too high (around 90 bpm). Discussed tobacco, stimulants, cardiovascular fitness, Discussed vagal tone. She is not active at baseline, working to increase this.   Feels rare flutter, "like something is getting ready to happen." Brief (<1 minute), nonlimiting. She tries to cough to see if this will make it go away, sometimes helps. No syncope but can feel lightheaded for a short time. Happening no more than once a month. Has an apple watch, has ability to take ECGs, she will try to capture one of these episodes.  ROS: Denies chest pain, shortness of breath at rest or with normal exertion. No PND, orthopnea, LE edema or unexpected weight gain. No syncope. ROS otherwise negative except as noted.   Studies Reviewed: Marland Kitchen    EKG:       Physical Exam:   VS:  BP 128/86 (BP Location: Left Arm, Cuff Size: Normal)   Pulse 88   Ht 5\' 5"  (1.651 m)   Wt 176 lb (79.8 kg)   SpO2 98%   BMI 29.29 kg/m    Wt Readings from Last 3 Encounters:  03/17/23 176 lb (79.8 kg)  12/31/21 196 lb 6.4 oz (89.1 kg)  11/22/21 195 lb 11.2 oz (88.8 kg)    GEN: Well nourished, well developed in no acute  distress HEENT: Normal, moist mucous membranes NECK: No JVD CARDIAC: regular rhythm, normal S1 and S2, no rubs or gallops. No murmur. VASCULAR: Radial and DP pulses 2+ bilaterally. No carotid bruits RESPIRATORY:  Clear to auscultation without rales, wheezing or rhonchi  ABDOMEN: Soft, non-tender, non-distended MUSCULOSKELETAL:  Ambulates independently SKIN: Warm and dry, no edema NEUROLOGIC:  Alert and oriented x 3. No focal neuro deficits noted. PSYCHIATRIC:  Normal affect    ASSESSMENT AND PLAN: .    Nonobstructive CAD Aortic atherosclerosis Mixed hyperlipidemia -tolerating rosuvastatin, lipids from [facility] 08/2022 show Tchol 154, HDL 46, LDL 77, TG 183 -discussed increased statin vs. Working on lifestyle. She would like to focus on lifestyle for now. Recheck labs in 6 mos, prior to next visit -discussed importance of tobacco cessation -tolerating aspirin   Hypertension: near goal today -continue HCTZ -discussed home BP monitoring   Resting heart rate 90s: discussed tobacco, activity. Buproprion is also a stimulant  hepatic steatosis noted on CT Abnormal glucose Consistent with metabolic syndrome -A1c 6.3 -BMI 32->29 on Ozempic   Tobacco cessation: counseled on cessation  CV risk counseling and prevention -recommend heart healthy/Mediterranean diet, with whole grains, fruits, vegetable, fish, lean meats, nuts, and olive oil. Limit salt. -recommend moderate walking, 3-5 times/week for 30-50 minutes each session. Aim for at least 150 minutes.week. Goal should be pace of 3 miles/hours, or walking 1.5 miles  in 30 minutes -recommend avoidance of tobacco products. Avoid excess alcohol.  Dispo: July 2025 or sooner as needed  Signed, Jodelle Red, MD   Jodelle Red, MD, PhD, [facility]  [facility]  [place]  Heart & Vascular at [facility] at [facility] [address], [address] [place], [place] [address] [phone]

## 2023-06-24 ENCOUNTER — Ambulatory Visit (INDEPENDENT_AMBULATORY_CARE_PROVIDER_SITE_OTHER): Payer: 59 | Admitting: Internal Medicine

## 2023-06-24 ENCOUNTER — Encounter: Payer: Self-pay | Admitting: Internal Medicine

## 2023-06-24 VITALS — BP 108/76 | HR 92 | Ht 65.0 in | Wt 168.5 lb

## 2023-06-24 DIAGNOSIS — K5732 Diverticulitis of large intestine without perforation or abscess without bleeding: Secondary | ICD-10-CM | POA: Diagnosis not present

## 2023-06-24 DIAGNOSIS — Z860101 Personal history of adenomatous and serrated colon polyps: Secondary | ICD-10-CM | POA: Diagnosis not present

## 2023-06-24 DIAGNOSIS — Z860100 Personal history of colon polyps, unspecified: Secondary | ICD-10-CM

## 2023-06-24 NOTE — Progress Notes (Signed)
 Patient ID: Rachel Bell, female   DOB: Dec 11, 1965, 58 y.o.   MRN: [id number] HPI: Rachel Bell is a 58 year old female with a history of colonic polyps, hypertension hyperlipidemia and arthritis who is here to discuss possible diverticulitis and abdominal pain.  She is known to me from her colonoscopies and was last seen at the time of her last colonoscopy in February 2023.  She is here alone today.  She has been experiencing sharp pains in the lower abdomen, particularly during urination and bowel movements, for the past couple of weeks. Initially thought to be gas, the pain persisted for about four days before she sought medical attention. No current urinary symptoms, and bowel movements have resumed, although she has experienced some irregularities over the past year.  Recently, she was treated at a walk-in clinic for a suspected urinary tract infection with antibiotics, although the culture did not grow any bacteria. Despite completing the antibiotic course, she continued to experience abdominal pressure. During a wellness visit last Friday, she was prescribed Augmentin 875 mg twice daily for ten days, suspecting diverticulitis. The pain has slightly lessened, but she still feels pressure in the abdomen. No current urinary symptoms, diarrhea, nausea, vomiting, or fever, although she mentions occasional nausea.  Approximately a year ago, she experienced similar abdominal pain lasting several days, which she initially attributed to gas. She adjusted her diet, and the symptoms resolved without antibiotics.  She has been on Ozempic since June of the previous year and has lost almost thirty pounds. She is on a 2 mg dose and reports being able to differentiate between the side effects of Ozempic and her current symptoms. No nausea, vomiting, or fever, although she mentions occasional nausea.  Her recent metabolic panel did not show any abnormalities, including no elevation in white blood cell count.   This was at her recent physical with primary care [facility].   Past Medical History:  Diagnosis Date   Anxiety    Arthritis    Cancer (HCC)    skin cancer   Depression    Family history of breast cancer    Hemorrhoids    Hyperlipidemia    Hypertension     Past Surgical History:  Procedure Laterality Date   CESAREAN SECTION     COLONOSCOPY     > 11 yrs in [place]- pt cannot find records or who    MYOMECTOMY     PARTIAL HYSTERECTOMY      Outpatient Medications Prior to Visit  Medication Sig Dispense Refill   amoxicillin-clavulanate (AUGMENTIN) 875-125 MG tablet Take 1 tablet by mouth every 12 (twelve) hours. For 10 days     aspirin EC 81 MG tablet Take 1 tablet (81 mg total) by mouth daily. Swallow whole. 90 tablet 3   buPROPion (WELLBUTRIN SR) 150 MG 12 hr tablet Take 150 mg by mouth daily.      hydrochlorothiazide (HYDRODIURIL) 25 MG tablet Take 25 mg by mouth daily.     PRESCRIPTION MEDICATION Apply 1 patch topically every 3 (three) days. Wed and sat. vivelle dot micro.     rosuvastatin (CRESTOR) 20 MG tablet Take 1 tablet (20 mg total) by mouth daily. 90 tablet 3   Semaglutide (OZEMPIC, 1 MG/DOSE, [place]) Inject 2 mg into the skin once a week.     No facility-administered medications prior to visit.    No Known Allergies  Family History  Adopted: Yes  Problem Relation Age of Onset   Cancer Mother 15  died of a rare type of cancer- maybe sarcoma?   Breast cancer Half-Sister 36       died at 66 of breast cancer   Colon cancer Neg Hx    Colon polyps Neg Hx    Rectal cancer Neg Hx    Stomach cancer Neg Hx    Esophageal cancer Neg Hx     Social History   Tobacco Use   Smoking status: Every Day    Current packs/day: 1.00    Types: Cigarettes   Smokeless tobacco: Never  Vaping Use   Vaping status: Never Used  Substance Use Topics   Alcohol use: Yes    Alcohol/week: 0.0 standard drinks of alcohol    Comment: socially   Drug use: No    ROS: As per history of  present illness, otherwise negative  BP 108/76   Pulse 92   Ht 5\' 5"  (1.651 m)   Wt 168 lb 8 oz (76.4 kg)   BMI 28.04 kg/m  Gen: awake, alert, NAD HEENT: anicteric  Abd: Left lower quadrant tenderness without rebound or guarding, no hepatosplenomegaly, positive bowel sounds and no distention Ext: no c/c/e Neuro: nonfocal   RELEVANT LABS AND IMAGING: CBC    Component Value Date/Time   WBC 8.8 11/18/2021 2002   RBC 4.92 11/18/2021 2002   HGB 14.8 11/18/2021 2002   HCT 43.4 11/18/2021 2002   PLT 301 11/18/2021 2002   MCV 88.2 11/18/2021 2002   MCH 30.1 11/18/2021 2002   MCHC 34.1 11/18/2021 2002   RDW 12.3 11/18/2021 2002    CMP     Component Value Date/Time   NA 138 11/18/2021 2002   K 3.3 (L) 11/18/2021 2002   CL 104 11/18/2021 2002   CO2 26 11/18/2021 2002   GLUCOSE 97 11/18/2021 2002   BUN 10 11/18/2021 2002   CREATININE 0.63 11/18/2021 2002   CALCIUM 9.0 11/18/2021 2002   GFRNONAA >60 11/18/2021 2002   DIAGNOSTIC Colonoscopy: Three polyps 4mm or smaller removed from the cecum and transverse colon. Small mouth diverticula in the sigmoid and small internal hemorrhoids. (05/10/2021)  PATHOLOGY Tubular adenoma. (05/10/2021)  ASSESSMENT/PLAN: Diverticulitis Likely smoldering diverticulitis in the sigmoid colon. Explained anatomical relationship between sigmoid colon and bladder. Emphasized completing antibiotics and discussed potential gut microbiome disruption. Probiotics not recommended; suggested dietary sources like yogurt. Discussed prevalence of diverticulosis and diverticulitis. - Complete the course of Augmentin.  If pain not completely resolved at the end of the course or shortly thereafter she should let me know. - Start Metamucil one tablespoon daily, increasing to two tablespoons after a week, post-antibiotic treatment. - Adopt a low residue diet until pain-free, then transition to a balanced diet. - Avoid popcorn due to potential irritation of  diverticula. - If symptoms persist, consider a CT scan of the sigmoid colon.  Colonoscopy follow-up Colonoscopy in October 2023 showed non-advanced adenomas and small polyps removed. Pathology revealed tubular adenoma. Recommended repeat colonoscopy in February 2028 as per guidelines. - Schedule repeat colonoscopy in February 2028.   30 minutes total spent today including patient facing time, coordination of care, reviewing medical history/procedures/pertinent radiology studies, and documentation of the encounter.   BJ:[id number], Dibas, Md [address] [place],  [place] [address]

## 2023-06-24 NOTE — Patient Instructions (Addendum)
 Remain on a low residue diet until you are pain free.   Please purchase the following medications over the counter and take as directed: Metamucil 1 tablespoon leading up to a heaping 2 tablespoons daily.   MyChart message our office in one month with an update of your symptoms.   _______________________________________________________  If your blood pressure at your visit was 140/90 or greater, please contact your primary care physician to follow up on this.  _______________________________________________________  If you are age 59 or older, your body mass index should be between 23-30. Your Body mass index is 28.04 kg/m. If this is out of the aforementioned range listed, please consider follow up with your Primary Care Provider.  If you are age 31 or younger, your body mass index should be between 19-25. Your Body mass index is 28.04 kg/m. If this is out of the aformentioned range listed, please consider follow up with your Primary Care Provider.   ________________________________________________________  The [facility] providers would like to encourage you to use [facility] to communicate with providers for non-urgent requests or questions.  Due to long hold times on the telephone, sending your provider a message by [facility] may be a faster and more efficient way to get a response.  Please allow 48 business hours for a response.  Please remember that this is for non-urgent requests.  _______________________________________________________

## 2024-01-11 ENCOUNTER — Other Ambulatory Visit (HOSPITAL_BASED_OUTPATIENT_CLINIC_OR_DEPARTMENT_OTHER): Payer: Self-pay | Admitting: Cardiology

## 2024-01-11 DIAGNOSIS — I2510 Atherosclerotic heart disease of native coronary artery without angina pectoris: Secondary | ICD-10-CM

## 2024-01-11 DIAGNOSIS — I700 Atherosclerosis of aorta: Secondary | ICD-10-CM

## 2024-01-11 DIAGNOSIS — E7800 Pure hypercholesterolemia, unspecified: Secondary | ICD-10-CM

## 2024-03-14 ENCOUNTER — Other Ambulatory Visit: Payer: Self-pay | Admitting: Obstetrics & Gynecology

## 2024-03-14 MED ORDER — CELECOXIB 100 MG PO CAPS: 100.0000 mg | ORAL_CAPSULE | Freq: Two times a day (BID) | ORAL | 0 refills | Status: AC

## 2024-03-14 NOTE — Progress Notes (Unsigned)
 Exam c/w costochondritis. Rx Celebrex 

## 2024-03-16 ENCOUNTER — Ambulatory Visit (INDEPENDENT_AMBULATORY_CARE_PROVIDER_SITE_OTHER): Admitting: Podiatry

## 2024-03-16 ENCOUNTER — Encounter: Payer: Self-pay | Admitting: Podiatry

## 2024-03-16 VITALS — Ht 65.0 in | Wt 168.5 lb

## 2024-03-16 DIAGNOSIS — L600 Ingrowing nail: Secondary | ICD-10-CM | POA: Diagnosis not present

## 2024-03-29 NOTE — Progress Notes (Signed)
" ° °  Chief Complaint  Patient presents with   Ingrown Toenail    Pt is here to due to possible ingrown to the right great toenail, and nail fungus.    Subjective: Patient presents today for evaluation of abnormality to the right great toenail. Patient is concerned for possible ingrown nail.  It is very sensitive to touch.  Patient presents today for further treatment and evaluation.  Past Medical History:  Diagnosis Date   Anxiety    Arthritis    Cancer (HCC)    skin cancer   Depression    Family history of breast cancer    Hemorrhoids    Hyperlipidemia    Hypertension     Past Surgical History:  Procedure Laterality Date   CESAREAN SECTION     COLONOSCOPY     > 11 yrs in [place] - pt cannot find records or who    MYOMECTOMY     PARTIAL HYSTERECTOMY      Allergies[1]  Objective:  General: Well developed, nourished, in no acute distress, alert and oriented x3   Dermatology: Skin is warm, dry and supple bilateral.  Right hallux nail plate is tender with evidence of an ingrowing nail. Pain on palpation noted to the border of the nail fold. The remaining nails appear unremarkable at this time.   Vascular: DP and PT pulses palpable.  No clinical evidence of vascular compromise  Neruologic: Grossly intact via light touch bilateral.  Musculoskeletal: No pedal deformity noted  Assesement: #1  Mild localized ingrowing portion of toenail right hallux nail plate  Plan of Care:  -Patient evaluated.  - The distal corner of the nail plate was debrided today.  Patient did feel some relief with this.  Recommend conservative treatment including maintaining good foot hygiene and refrain from shoes that constrict or irritate the toes -Return to clinic PRN  Thresa EMERSON Sar, DPM [facility]  Dr. Thresa EMERSON Sar, DPM    [address] [place], [place] [address]                Office [phone]  Fax [phone]        [1]  No Known Allergies  "

## 2024-04-19 ENCOUNTER — Other Ambulatory Visit (HOSPITAL_BASED_OUTPATIENT_CLINIC_OR_DEPARTMENT_OTHER): Payer: Self-pay | Admitting: Cardiology

## 2024-04-19 DIAGNOSIS — I700 Atherosclerosis of aorta: Secondary | ICD-10-CM

## 2024-04-19 DIAGNOSIS — I2510 Atherosclerotic heart disease of native coronary artery without angina pectoris: Secondary | ICD-10-CM

## 2024-04-19 DIAGNOSIS — E7800 Pure hypercholesterolemia, unspecified: Secondary | ICD-10-CM

## 2024-04-22 NOTE — Telephone Encounter (Signed)
 Patient need make a overdue appointment for refills. Thank 1st attempt
